# Patient Record
Sex: Male | Born: 1980 | Race: White | Hispanic: No | Marital: Married | Smoking: Never smoker
Health system: Southern US, Community
[De-identification: ages and names within clinical notes are randomized; demographics above are authoritative.]

## PROBLEM LIST (undated history)

## (undated) DIAGNOSIS — R569 Unspecified convulsions: Secondary | ICD-10-CM

## (undated) DIAGNOSIS — Q282 Arteriovenous malformation of cerebral vessels: Secondary | ICD-10-CM

## (undated) HISTORY — DX: Arteriovenous malformation of cerebral vessels: Q28.2

## (undated) HISTORY — DX: Unspecified convulsions: R56.9

## (undated) HISTORY — PX: FOOT CAPSULE RELEASE W/ PERCUTANEOUS HEEL CORD LENGTHENING, TIBIAL TENDON TRANSFER: SHX1658

## (undated) HISTORY — PX: APPENDECTOMY: SHX54

## (undated) HISTORY — PX: OTHER SURGICAL HISTORY: SHX169

---

## 2000-12-11 ENCOUNTER — Emergency Department (HOSPITAL_COMMUNITY): Admission: EM | Admit: 2000-12-11 | Discharge: 2000-12-11 | Payer: Self-pay | Admitting: Emergency Medicine

## 2002-01-04 ENCOUNTER — Encounter: Payer: Self-pay | Admitting: Emergency Medicine

## 2002-01-04 ENCOUNTER — Emergency Department (HOSPITAL_COMMUNITY): Admission: EM | Admit: 2002-01-04 | Discharge: 2002-01-04 | Payer: Self-pay | Admitting: Emergency Medicine

## 2004-08-06 ENCOUNTER — Emergency Department (HOSPITAL_COMMUNITY): Admission: EM | Admit: 2004-08-06 | Discharge: 2004-08-06 | Payer: Self-pay | Admitting: Emergency Medicine

## 2010-03-20 ENCOUNTER — Ambulatory Visit: Payer: Self-pay | Admitting: Diagnostic Radiology

## 2010-03-20 ENCOUNTER — Emergency Department (HOSPITAL_BASED_OUTPATIENT_CLINIC_OR_DEPARTMENT_OTHER): Admission: EM | Admit: 2010-03-20 | Discharge: 2010-03-20 | Payer: Self-pay | Admitting: Emergency Medicine

## 2010-07-08 LAB — BASIC METABOLIC PANEL
BUN: 18 mg/dL (ref 6–23)
Calcium: 9.8 mg/dL (ref 8.4–10.5)
GFR calc non Af Amer: >60 mL/min (ref >60)
Glucose, Bld: 114 mg/dL — ABNORMAL HIGH (ref 70–99)
Sodium: 142 mEq/L (ref 135–145)

## 2010-07-08 LAB — DIFFERENTIAL
Basophils Absolute: 0.1 10*3/uL (ref 0.0–0.1)
Basophils Relative: 1 % (ref 0–1)
Neutro Abs: 7.3 10*3/uL (ref 1.7–7.7)
Neutrophils Relative %: 63 % (ref 43–77)

## 2010-07-08 LAB — CBC
MCHC: 35.5 g/dL (ref 30.0–36.0)
Platelets: 171 10*3/uL (ref 150–400)
RDW: 11.1 % — ABNORMAL LOW (ref 11.5–15.5)

## 2012-08-16 ENCOUNTER — Telehealth: Payer: Self-pay | Admitting: *Deleted

## 2012-08-16 NOTE — Telephone Encounter (Signed)
Message copied by Harlon Flor Mariana Goytia L on Tue Aug 16, 2012 10:02 AM ------      Message from: Richrd Prime      Created: Tue Aug 16, 2012  8:44 AM      Contact: Patient       Patient states he has a conflict with his appt.  He wants to know if he can be pushed back the day of his appt. 08/19/2012.  He would like to be called asap.  You can contact him at either phone number.              [facility] ------

## 2012-08-16 NOTE — Telephone Encounter (Signed)
Called patient to resched, stated that he has been sched already

## 2012-08-19 ENCOUNTER — Institutional Professional Consult (permissible substitution): Payer: No Typology Code available for payment source | Admitting: Neurology

## 2012-08-26 ENCOUNTER — Encounter: Payer: Self-pay | Admitting: Neurology

## 2012-08-26 ENCOUNTER — Ambulatory Visit (INDEPENDENT_AMBULATORY_CARE_PROVIDER_SITE_OTHER): Payer: No Typology Code available for payment source | Admitting: Neurology

## 2012-08-26 VITALS — BP 120/70 | HR 68 | Ht 75.0 in | Wt 290.0 lb

## 2012-08-26 DIAGNOSIS — M79609 Pain in unspecified limb: Secondary | ICD-10-CM

## 2012-08-26 DIAGNOSIS — G40309 Generalized idiopathic epilepsy and epileptic syndromes, not intractable, without status epilepticus: Secondary | ICD-10-CM

## 2012-08-26 MED ORDER — LAMOTRIGINE ER 50 MG PO TB24: 50.0000 mg | ORAL_TABLET | Freq: Two times a day (BID) | ORAL | Status: DC

## 2012-08-26 NOTE — Progress Notes (Signed)
Reason for visit: Seizures   Michael Stark is a 32 y.o. 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 The patient last had a seizure in 2012. The patient indicates that he has been placed on low-dose Lamictal, and he is tolerating the medication well. The patient was on Keppra, but this resulted in too much irritability. The patient has had most of his seizures during sleep or in the early morning just prior to awakening. The patient has generalized tonic clonic seizure events. The patient has had other episodes that include transient numbness of the left face and left arm, and speech arrest. These are unassociated with cognitive clouding. The patient is operating a motor vehicle without problems. The patient works as a Runner, broadcasting/film/video. The patient comes to this office as he has recently moved to this area from [place], [place]. The patient reports some right foot pain with weightbearing, and he wishes to have a podiatry referral.  Past Medical History  Diagnosis Date  . Seizures   . 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 Appendectomy    . Heal lengthening    . Foot capsule release w/ percutaneous heel cord lengthening, tibial tendon transfer      Family History  Problem Relation Age of Onset  . Anxiety disorder Mother   . Anxiety disorder Brother     Social history:  reports that he has never smoked. He does not have any smokeless tobacco history on file. He reports that  drinks alcohol. He reports that he does not use illicit drugs.  Medications:  No current outpatient prescriptions on file prior to visit.   No current facility-administered medications on file prior to visit.    Allergies:  Allergies  Allergen Reactions  . Shellfish Allergy Nausea And Vomiting    Chills    ROS:  Out of a  complete 14 system review of symptoms, the patient complains only of the following symptoms, and all other reviewed systems are negative.  Snoring Blood in stool Episodes of numbness Seizures Depression Allergies  Blood pressure 120/70, pulse 68, height 6\' 3"  (1.905 m), weight 290 lb (131.543 kg).  Physical Exam  General: The patient is alert and cooperative at the time of the examination. The patient is mildly obese.  Head: Pupils are equal, round, and reactive to light. Discs are flat bilaterally.  Neck: The neck is supple, no carotid bruits are noted.  Respiratory: The respiratory examination is clear.  Cardiovascular: The cardiovascular examination reveals a regular rate and rhythm, no obvious murmurs or rubs are noted.  Skin: Extremities are without significant edema. The calf muscles of the left leg are atrophic compared to the right.  Neurologic Exam  Mental status:  Cranial nerves: Facial symmetry is present. There is good sensation of the face to pinprick and soft touch bilaterally. The strength of the facial muscles and the muscles to head turning and shoulder shrug are normal bilaterally. Speech is well enunciated, no aphasia or dysarthria is noted. Extraocular movements are full. Visual fields are full.  Motor: The motor testing reveals 5 over 5 strength of all 4 extremities. Good symmetric motor tone is noted throughout.  Sensory: Sensory testing is intact to pinprick, soft touch, vibration sensation, and position sense on all 4 extremities. No evidence of extinction is  noted.  Coordination: Cerebellar testing reveals good finger-nose-finger and heel-to-shin bilaterally.  Gait and station: Gait is normal. Tandem gait is normal. Romberg is negative. No drift is seen.  Reflexes: Deep tendon reflexes are symmetric and normal bilaterally. Toes are downgoing bilaterally.   Assessment/Plan:  1. History seizures  2. Right brain AVM  The patient is doing fairly  well at this time on low-dose Lamictal. If the episodes of speech arrest or left sided numbness become more frequent, I would prefer to increase the dose of the Lamictal. The patient will followup in 6-8 months. A prescription was written for the Lamictal.  Marlan Palau MD 08/28/2012 5:13 PM  [facility] [address] [place], [place] [address]  Phone [phone] Fax [phone]

## 2012-08-29 ENCOUNTER — Telehealth: Payer: Self-pay

## 2012-08-29 MED ORDER — LAMOTRIGINE 100 MG PO TABS: 100.0000 mg | ORAL_TABLET | Freq: Two times a day (BID) | ORAL | Status: DC

## 2012-08-29 NOTE — Telephone Encounter (Signed)
John from [facility] called and left a message with clinic saying they want to verify the Lamictal Rx.  They indicate the patient previously was taking 100mg  BID.  The current Rx they have is for 50mg  XR BID.  They state the insurance does not cover the XR at a BID dose, and they would like to know if the patient should in fact be taking the XR or if he should go back to regular release.  Please advise.  Thank you.

## 2012-08-29 NOTE — Telephone Encounter (Signed)
I called the pharmacy. The patient had indicated that he was only taking 50 mg twice daily of the Lamictal. In fact, the patient was on 100 mg twice daily. I will continue this prescription. The patient was given 60 tablets and 5 refills.

## 2013-02-25 ENCOUNTER — Other Ambulatory Visit: Payer: Self-pay | Admitting: Neurology

## 2013-02-28 ENCOUNTER — Other Ambulatory Visit: Payer: Self-pay

## 2013-03-20 ENCOUNTER — Encounter: Payer: Self-pay | Admitting: Neurology

## 2013-03-20 ENCOUNTER — Telehealth: Payer: Self-pay | Admitting: Neurology

## 2013-03-20 ENCOUNTER — Ambulatory Visit (INDEPENDENT_AMBULATORY_CARE_PROVIDER_SITE_OTHER): Payer: No Typology Code available for payment source | Admitting: Neurology

## 2013-03-20 ENCOUNTER — Encounter (INDEPENDENT_AMBULATORY_CARE_PROVIDER_SITE_OTHER): Payer: Self-pay

## 2013-03-20 VITALS — BP 115/67 | HR 70 | Temp 97.9°F | Ht 75.0 in | Wt 302.0 lb

## 2013-03-20 DIAGNOSIS — Z5181 Encounter for therapeutic drug level monitoring: Secondary | ICD-10-CM

## 2013-03-20 DIAGNOSIS — G40309 Generalized idiopathic epilepsy and epileptic syndromes, not intractable, without status epilepticus: Secondary | ICD-10-CM

## 2013-03-20 MED ORDER — TRAZODONE HCL 50 MG PO TABS: 50.0000 mg | ORAL_TABLET | ORAL | Status: DC | PRN

## 2013-03-20 MED ORDER — LAMOTRIGINE 100 MG PO TABS: 100.0000 mg | ORAL_TABLET | Freq: Two times a day (BID) | ORAL | Status: DC

## 2013-03-20 NOTE — Progress Notes (Signed)
    Reason for visit: Seizures  DENZEL ETIENNE is an 32 y.o. male  History of present illness:  Mr. Soley is a 33 year old right-handed white male with a history of seizures. The patient has done well since last seen, and he reports that the episodes of speech arrest that got much better. The patient is sleeping better with the Trazodone and he is tolerating the lamotrigine taking 100 mg twice daily. The patient is operating a motor vehicle without difficulty. The patient reports no other new medical issues that have come up since last seen. The patient is under less stress currently, overall doing better.  Past Medical History  Diagnosis Date  . Seizures   . 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 Appendectomy    . Heal lengthening    . Foot capsule release w/ percutaneous heel cord lengthening, tibial tendon transfer      Family History  Problem Relation Age of Onset  . Anxiety disorder Mother   . Anxiety disorder Brother     Social history:  reports that he has never smoked. He has never used smokeless tobacco. He reports that he drinks alcohol. He reports that he does not use illicit drugs.    Allergies  Allergen Reactions  . Other     Cats, and seasonal    Medications:  No current outpatient prescriptions on file prior to visit.   No current facility-administered medications on file prior to visit.    ROS:  Out of a complete 14 system review of symptoms, the patient complains only of the following symptoms, and all other reviewed systems are negative.  History of seizures Snoring Insomnia  Blood pressure 115/67, pulse 70, temperature 97.9 F (36.6 C), temperature source Oral, height 6\' 3"  (1.905 m), weight 302 lb (136.986 kg).  Physical Exam  General: The patient is alert and cooperative at the time of the examination. The patient is moderately obese.  Skin: No  significant peripheral edema is noted.   Neurologic Exam  Mental status: The patient is oriented x 3.  Cranial nerves: Facial symmetry is present. Speech is normal, no aphasia or dysarthria is noted. Extraocular movements are full. Visual fields are full.  Motor: The patient has good strength in all 4 extremities. There is atrophy of the left calf muscle relative to the right.  Sensory examination: Soft touch sensation on the face, arms, and legs is symmetric.  Coordination: The patient has good finger-nose-finger and heel-to-shin bilaterally.  Gait and station: The patient has a slightly limping type gait on the left leg. Tandem gait is unsteady. Romberg is negative. No drift is seen.  Reflexes: Deep tendon reflexes are symmetric, with the exception that the left ankle jerk reflexes depressed absent..   Assessment/Plan:   1. Seizures  The patient doing fairly well with the seizures this point. We will check blood work today, and followup with the patient in about 6-8 months. The patient continues to do well, he can be seen once a year.   Marlan Palau MD 03/20/2013 8:36 PM  [facility] [address] [place], [place] [address]  Phone [phone] Fax [phone]

## 2013-03-20 NOTE — Patient Instructions (Signed)
Epilepsy A seizure (convulsion) is a sudden change in brain function that causes a change in behavior, muscle activity, or ability to remain awake and alert. If a person has recurring seizures, this is called epilepsy. CAUSES  Epilepsy is a disorder with many possible causes. Anything that disturbs the normal pattern of brain cell activity can lead to seizures. Seizure can be caused from illness to brain damage to abnormal brain development. Epilepsy may develop because of:  An abnormality in brain wiring.  An imbalance of nerve signaling chemicals (neurotransmitters).  Some combination of these factors. Scientists are learning an increasing amount about genetic causes of seizures. SYMPTOMS  The symptoms of a seizure can vary greatly from one person to another. These may include:  An aura, or warning that tells a person they are about to have a seizure.  Abnormal sensations, such as abnormal smell or seeing flashing lights.  Sudden, general body stiffness.  Rhythmic jerking of the face, arm, or leg  on one or both sides.  Sudden change in consciousness.  The person may appear to be awake but not responding.  They may appear to be asleep but cannot be awakened.  Grimacing, chewing, lip smacking, or drooling.  Often there is a period of sleepiness after a seizure. DIAGNOSIS  The description you give to your caregiver about what you experienced will help them understand your problems. Equally important is the description by any witnesses to your seizure. A physical exam, including a detailed neurological exam, is necessary. An EEG (electroencephalogram) is a painless test of your brain waves. In this test a diagram is created of your brain waves. These diagrams can be interpreted by a specialist. Pictures of your brain are usually taken with:  An MRI.  A CT scan. Lab tests may be done to look for:  Signs of infection.  Abnormal blood chemistry. PREVENTION  There is no way to  prevent the development of epilepsy. If you have seizures that are typically triggered by an event (such as flashing lights), try to avoid the trigger. This can help you avoid a seizure.  PROGNOSIS  Most people with epilepsy lead outwardly normal lives. While epilepsy cannot currently be cured, for some people it does eventually go away. Most seizures do not cause brain damage. It is not uncommon for people with epilepsy, especially children, to develop behavioral and emotional problems. These problems are sometimes the consequence of medicine for seizures or social stress. For some people with epilepsy, the risk of seizures restricts their independence and recreational activities. For example, some states refuse drivers licenses to people with epilepsy. Most women with epilepsy can become pregnant. They should discuss their epilepsy and the medicine they are taking with their caregivers. Women with epilepsy have a 90 percent or better chance of having a normal, healthy baby. RISKS AND COMPLICATIONS  People with epilepsy are at increased risk of falls, accidents, and injuries. People with epilepsy are at special risk for two life-threatening conditions. These are status epilepticus and sudden unexplained death (extremely rare). Status epilepticus is a long lasting, continuous seizure that is a medical emergency. TREATMENT  Once epilepsy is diagnosed, it is important to begin treatment as soon as possible. For about 80 percent of those diagnosed with epilepsy, seizures can be controlled with modern medicines and surgical techniques. Some antiepileptic drugs can interfere with the effectiveness of oral contraceptives. In 1997, the FDA approved a pacemaker for the brain the (vagus nerve stimulator). This stimulator can be used for   people with seizures that are not well-controlled by medicine. Studies have shown that in some cases, children may experience fewer seizures if they maintain a strict diet. The strict  diet is called the ketogenic diet. This diet is rich in fats and low in carbohydrates. HOME CARE INSTRUCTIONS   Your caregiver will make recommendations about driving and safety in normal activities. Follow these carefully.  Take any medicine prescribed exactly as directed.  Do any blood tests requested to monitor the levels of your medicine.  The people you live and work with should know that you are prone to seizures. They should receive instructions on how to help you. In general, a witness to a seizure should:  Cushion your head and body.  Turn you on your side.  Avoid unnecessarily restraining you.  Not place anything inside your mouth.  Call for local emergency medical help if there is any question about what has occurred.  Keep a seizure diary. Record what you recall about any seizure, especially any possible trigger.  If your caregiver has given you a follow-up appointment, it is very important to keep that appointment. Not keeping the appointment could result in permanent injury and disability. If there is any problem keeping the appointment, you must call back to this facility for assistance. SEEK MEDICAL CARE IF:   You develop signs of infection or other illness. This might increase the risk of a seizure.  You seem to be having more frequent seizures.  Your seizure pattern is changing. SEEK IMMEDIATE MEDICAL CARE IF:   A seizure does not stop after a few moments.  A seizure causes any difficulty in breathing.  A seizure results in a very severe headache.  A seizure leaves you with the inability to speak or use a part of your body. MAKE SURE YOU:   Understand these instructions.  Will watch your condition.  Will get help right away if you are not doing well or get worse. Document Released: 04/13/2005 Document Revised: 07/06/2011 Document Reviewed: 11/23/2012 [employer] Patient Information 2014 [employer].  

## 2013-03-20 NOTE — Telephone Encounter (Signed)
The patient did not show for the appointment today. 

## 2013-03-20 NOTE — Telephone Encounter (Signed)
The patient actually showed up one hour late for his appointment. The patient was seen today.

## 2013-03-22 LAB — CBC WITH DIFFERENTIAL
Eos: 4 %
Eosinophils Absolute: 0.3 10*3/uL (ref 0.0–0.4)
Immature Granulocytes: 0 %
Lymphocytes Absolute: 2.1 10*3/uL (ref 0.7–3.1)
MCH: 30.5 pg (ref 26.6–33.0)
MCHC: 34.8 g/dL (ref 31.5–35.7)
MCV: 88 fL (ref 79–97)
Monocytes Absolute: 0.6 10*3/uL (ref 0.1–0.9)
Neutrophils Relative %: 59 %
Platelets: 171 10*3/uL (ref 150–379)
RBC: 5.08 x10E6/uL (ref 4.14–5.80)

## 2013-03-22 LAB — LAMOTRIGINE LEVEL: Lamotrigine Lvl: 3.7 ug/mL (ref 2.0–20.0)

## 2013-03-22 LAB — COMPREHENSIVE METABOLIC PANEL
AST: 31 IU/L (ref 0–40)
Albumin/Globulin Ratio: 2 (ref 1.1–2.5)
Alkaline Phosphatase: 67 IU/L (ref 39–117)
CO2: 21 mmol/L (ref 18–29)
Calcium: 9.4 mg/dL (ref 8.7–10.2)
Chloride: 103 mmol/L (ref 97–108)
Globulin, Total: 2.2 g/dL (ref 1.5–4.5)
Glucose: 100 mg/dL — ABNORMAL HIGH (ref 65–99)
Potassium: 4.4 mmol/L (ref 3.5–5.2)
Total Bilirubin: 0.5 mg/dL (ref 0.0–1.2)
Total Protein: 6.7 g/dL (ref 6.0–8.5)

## 2013-09-19 ENCOUNTER — Ambulatory Visit: Payer: Self-pay | Admitting: Adult Health

## 2014-04-12 ENCOUNTER — Other Ambulatory Visit: Payer: Self-pay | Admitting: Neurology

## 2014-04-12 NOTE — Telephone Encounter (Signed)
Patient no showed last appt.  I called, got no answer.  Left message.  

## 2014-09-05 ENCOUNTER — Other Ambulatory Visit: Payer: Self-pay | Admitting: Neurology

## 2014-09-05 NOTE — Telephone Encounter (Signed)
Patient has not been seen since 2014.  No showed last appt.  I called patient, got no answer.  Left message.

## 2014-12-18 ENCOUNTER — Telehealth: Payer: Self-pay

## 2014-12-18 ENCOUNTER — Ambulatory Visit (INDEPENDENT_AMBULATORY_CARE_PROVIDER_SITE_OTHER): Payer: BC Managed Care – PPO | Admitting: Family Medicine

## 2014-12-18 ENCOUNTER — Encounter: Payer: Self-pay | Admitting: Family Medicine

## 2014-12-18 ENCOUNTER — Encounter (INDEPENDENT_AMBULATORY_CARE_PROVIDER_SITE_OTHER): Payer: Self-pay

## 2014-12-18 VITALS — BP 112/68 | HR 52 | Temp 97.8°F | Ht 74.5 in | Wt 237.1 lb

## 2014-12-18 DIAGNOSIS — Z13220 Encounter for screening for lipoid disorders: Secondary | ICD-10-CM

## 2014-12-18 DIAGNOSIS — Z0000 Encounter for general adult medical examination without abnormal findings: Secondary | ICD-10-CM | POA: Insufficient documentation

## 2014-12-18 DIAGNOSIS — G40309 Generalized idiopathic epilepsy and epileptic syndromes, not intractable, without status epilepticus: Secondary | ICD-10-CM

## 2014-12-18 DIAGNOSIS — R41840 Attention and concentration deficit: Secondary | ICD-10-CM | POA: Diagnosis not present

## 2014-12-18 DIAGNOSIS — I499 Cardiac arrhythmia, unspecified: Secondary | ICD-10-CM | POA: Diagnosis not present

## 2014-12-18 NOTE — Progress Notes (Signed)
Subjective:  Patient ID: Michael Stark, male    DOB: Jul 16, 1980  Age: 34 y.o. MRN: [id number]  CC: Establish care/Annual physical exam.  HPI Michael Stark is a 34 y.o. male presents to the clinic today to establish care and for an annual physical exam.  Additionally, patient has a few complaints (see below).  1) Preventative Healthcare  Immunizations: Up to date.  Labs: Per [facility], patient in need of lipid panel..  Diet/Exercise: Monitors caloric intake (1500 - 2000 kcals daily); Exercises regularly. 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   Smoking/tobacco use: No.  HIV testing: Performed in college.   Regular dental exams: Yes.   2) Irregular heart beats  Patient reports that for the past few weeks he has felt irregular heart beats.  He states that he feels that his heart "stops" or "pauses" and then it "beats harder"  He reports some associated SOB when it occurs.  No chest pain.  Last for minutes to hours (occuring intermittently) and resolves spontaneously.  He states that it occurs primarily while at work.  No relieving factors. No exacerbating factors.  Patient limits caffeine but has been under a lot of stress at work.   3) Trouble focusing/Difficulty concentrating  Patient reports that he had ADHD as a child.  He has been recently under a lot of stress at work.  He states he has difficulty focusing on tasks, remember details and individuals names.  He attributes this mainly to stress but is slightly concerned.  PMH, Surgical Hx, Family Hx, Social History reviewed and updated as below. Past Medical History  Diagnosis Date  . Seizures   . 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 Appendectomy    . Heal lengthening    . Foot capsule release w/ percutaneous heel cord lengthening, tibial tendon transfer      Family History  Problem Relation Age of Onset  . Anxiety  disorder Mother   . Anxiety disorder Brother     Social History  Substance Use Topics  . Smoking status: Never Smoker   . Smokeless tobacco: Never Used  . Alcohol Use: 0.0 - 0.6 oz/week    0-1 Standard drinks or equivalent per week     Comment: Consumes alcohol twice per month    Review of Systems  HENT: Positive for tinnitus.   Respiratory:       SOB w/ irregular heart beat.  Cardiovascular:       Irregular heart beat.  Neurological: Positive for seizures.  Psychiatric/Behavioral: Positive for decreased concentration. The patient is nervous/anxious.        Stress. Memory difficulties.  All other systems negative.  Objective:   Today's Vitals: BP 112/68 mmHg  Pulse 52  Temp(Src) 97.8 F (36.6 C) (Oral)  Ht 6' 2.5" (1.892 m)  Wt 237 lb 2 oz (107.559 kg)  BMI 30.05 kg/m2  SpO2 96%  Physical Exam  Constitutional: He is oriented to person, place, and time. He appears well-developed and well-nourished. No distress.  HENT:  Head: Normocephalic and atraumatic.  Nose: Nose normal.  Mouth/Throat: Oropharynx is clear and moist. No oropharyngeal exudate.  Normal TM's bilaterally.   Eyes: Conjunctivae are normal. No scleral icterus.  Neck: Neck supple. No thyromegaly present.  Cardiovascular: Regular rhythm.  Bradycardia present.   No murmur heard. Pulmonary/Chest: Effort normal and breath sounds normal. He has no wheezes. He has no rales.  Abdominal: Soft. He exhibits no distension. There is no tenderness. There is no rebound and no guarding.  Musculoskeletal: Normal range of motion. He exhibits no edema.  Lymphadenopathy:    He has no cervical adenopathy.  Neurological: He is alert and oriented to person, place, and time.  Skin: Skin is warm and dry. No rash noted.  Psychiatric: His behavior is normal. Thought content normal.  Anxious.  Vitals reviewed.  Assessment & Plan:   Problem List Items Addressed This Visit    Difficulty concentrating    Offered referral to  Attention specialist; patient would like to wait at this time. I suspect this is more related to stress; Discussed therapy and patient declined.       Generalized convulsive epilepsy (Chronic)    Followed by Neurology. Stable on Lamictal.      Irregular heart beats    History consistent with PAC's/PVC's. Patient reassured but still concerned. Recommended Holter for complete evaluation; Will refer to Cardiology.      Preventative health care    Up to date on immunizations. Labs: Lipid panel today. Patient exercises regularly. Advised to continue.       Other Visit Diagnoses    Routine general medical examination at a health care facility    -  Primary    Relevant Orders    ABO AND RH  (Completed)    Screening for lipid disorders        Relevant Orders    Lipid panel       Outpatient Encounter Prescriptions as of 12/18/2014  Medication Sig  . cetirizine (ZYRTEC) 10 MG tablet Take 10 mg by mouth once.  . lamoTRIgine (LAMICTAL) 100 MG tablet TAKE 1 TABLET (100 MG TOTAL) BY MOUTH 2 (TWO) TIMES DAILY.  . [DISCONTINUED] traZODone (DESYREL) 50 MG tablet Take 1 tablet (50 mg total) by mouth as needed for sleep.  . [DISCONTINUED] UNABLE TO FIND Allergy injection 2-3 times weekly   No facility-administered encounter medications on file as of 12/18/2014.    Follow-up: Annual or sooner if needed.   Michael Spikes DO

## 2014-12-18 NOTE — Patient Instructions (Addendum)
It was nice to meet you today.  We will call with your lab results.  We will also call with your referral to Cardiology.  If you decide not to pursue this just let me know.  If you decide to pursue ADHD testing please let me know.  Follow up annually or sooner if needed.  Take care  Dr. Lacinda Axon  Health Maintenance A healthy lifestyle and preventative care can promote health and wellness.  Maintain regular health, dental, and eye exams.  Eat a healthy diet. Foods like vegetables, fruits, whole grains, low-fat dairy products, and lean protein foods contain the nutrients you need and are low in calories. Decrease your intake of foods high in solid fats, added sugars, and salt. Get information about a proper diet from your health care provider, if necessary.  Regular physical exercise is one of the most important things you can do for your health. Most adults should get at least 150 minutes of moderate-intensity exercise (any activity that increases your heart rate and causes you to sweat) each week. In addition, most adults need muscle-strengthening exercises on 2 or more days a week.   Maintain a healthy weight. The body mass index (BMI) is a screening tool to identify possible weight problems. It provides an estimate of body fat based on height and weight. Your health care provider can find your BMI and can help you achieve or maintain a healthy weight. For males 20 years and older:  A BMI below 18.5 is considered underweight.  A BMI of 18.5 to 24.9 is normal.  A BMI of 25 to 29.9 is considered overweight.  A BMI of 30 and above is considered obese.  Maintain normal blood lipids and cholesterol by exercising and minimizing your intake of saturated fat. Eat a balanced diet with plenty of fruits and vegetables. Blood tests for lipids and cholesterol should begin at age 52 and be repeated every 5 years. If your lipid or cholesterol levels are high, you are over age 46, or you are at high  risk for heart disease, you may need your cholesterol levels checked more frequently.Ongoing high lipid and cholesterol levels should be treated with medicines if diet and exercise are not working.  If you smoke, find out from your health care provider how to quit. If you do not use tobacco, do not start.  Lung cancer screening is recommended for adults aged 66-80 years who are at high risk for developing lung cancer because of a history of smoking. A yearly low-dose CT scan of the lungs is recommended for people who have at least a 30-pack-year history of smoking and are current smokers or have quit within the past 15 years. A pack year of smoking is smoking an average of 1 pack of cigarettes a day for 1 year (for example, a 30-pack-year history of smoking could mean smoking 1 pack a day for 30 years or 2 packs a day for 15 years). Yearly screening should continue until the smoker has stopped smoking for at least 15 years. Yearly screening should be stopped for people who develop a health problem that would prevent them from having lung cancer treatment.  If you choose to drink alcohol, do not have more than 2 drinks per day. One drink is considered to be 12 oz (360 mL) of beer, 5 oz (150 mL) of wine, or 1.5 oz (45 mL) of liquor.  Avoid the use of street drugs. Do not share needles with anyone. Ask for help if  you need support or instructions about stopping the use of drugs.  High blood pressure causes heart disease and increases the risk of stroke. Blood pressure should be checked at least every 1-2 years. Ongoing high blood pressure should be treated with medicines if weight loss and exercise are not effective.  If you are 74-33 years old, ask your health care provider if you should take aspirin to prevent heart disease.  Diabetes screening involves taking a blood sample to check your fasting blood sugar level. This should be done once every 3 years after age 13 if you are at a normal weight and  without risk factors for diabetes. Testing should be considered at a younger age or be carried out more frequently if you are overweight and have at least 1 risk factor for diabetes.  Colorectal cancer can be detected and often prevented. Most routine colorectal cancer screening begins at the age of 66 and continues through age 97. However, your health care provider may recommend screening at an earlier age if you have risk factors for colon cancer. On a yearly basis, your health care provider may provide home test kits to check for hidden blood in the stool. A small camera at the end of a tube may be used to directly examine the colon (sigmoidoscopy or colonoscopy) to detect the earliest forms of colorectal cancer. Talk to your health care provider about this at age 54 when routine screening begins. A direct exam of the colon should be repeated every 5-10 years through age 3, unless early forms of precancerous polyps or small growths are found.  People who are at an increased risk for hepatitis B should be screened for this virus. You are considered at high risk for hepatitis B if:  You were born in a country where hepatitis B occurs often. Talk with your health care provider about which countries are considered high risk.  Your parents were born in a high-risk country and you have not received a shot to protect against hepatitis B (hepatitis B vaccine).  You have HIV or AIDS.  You use needles to inject street drugs.  You live with, or have sex with, someone who has hepatitis B.  You are a man who has sex with other men (MSM).  You get hemodialysis treatment.  You take certain medicines for conditions like cancer, organ transplantation, and autoimmune conditions.  Hepatitis C blood testing is recommended for all people born from [birth year] through 1967 and any individual with known risk factors for hepatitis C.  Healthy men should no longer receive prostate-specific antigen (PSA) blood tests as  part of routine cancer screening. Talk to your health care provider about prostate cancer screening.  Testicular cancer screening is not recommended for adolescents or adult males who have no symptoms. Screening includes self-exam, a health care provider exam, and other screening tests. Consult with your health care provider about any symptoms you have or any concerns you have about testicular cancer.  Practice safe sex. Use condoms and avoid high-risk sexual practices to reduce the spread of sexually transmitted infections (STIs).  You should be screened for STIs, including gonorrhea and chlamydia if:  You are sexually active and are younger than 24 years.  You are older than 24 years, and your health care provider tells you that you are at risk for this type of infection.  Your sexual activity has changed since you were last screened, and you are at an increased risk for chlamydia or gonorrhea. Ask your  health care provider if you are at risk.  If you are at risk of being infected with HIV, it is recommended that you take a prescription medicine daily to prevent HIV infection. This is called pre-exposure prophylaxis (PrEP). You are considered at risk if:  You are a man who has sex with other men (MSM).  You are a heterosexual man who is sexually active with multiple partners.  You take drugs by injection.  You are sexually active with a partner who has HIV.  Talk with your health care provider about whether you are at high risk of being infected with HIV. If you choose to begin PrEP, you should first be tested for HIV. You should then be tested every 3 months for as long as you are taking PrEP.  Use sunscreen. Apply sunscreen liberally and repeatedly throughout the day. You should seek shade when your shadow is shorter than you. Protect yourself by wearing long sleeves, pants, a wide-brimmed hat, and sunglasses year round whenever you are outdoors.  Tell your health care provider of new  moles or changes in moles, especially if there is a change in shape or color. Also, tell your health care provider if a mole is larger than the size of a pencil eraser.  A one-time screening for abdominal aortic aneurysm (AAA) and surgical repair of large AAAs by ultrasound is recommended for men aged 81-75 years who are current or former smokers.  Stay current with your vaccines (immunizations). Document Released: 10/10/2007 Document Revised: 04/18/2013 Document Reviewed: 09/08/2010 [facility] Patient Information 2015 [place], [place]. This information is not intended to replace advice given to you by your health care provider. Make sure you discuss any questions you have with your health care provider.

## 2014-12-18 NOTE — Assessment & Plan Note (Signed)
Followed by Neurology. Stable on Lamictal.

## 2014-12-18 NOTE — Assessment & Plan Note (Signed)
Up to date on immunizations. Labs: Lipid panel today. Patient exercises regularly. Advised to continue.

## 2014-12-18 NOTE — Telephone Encounter (Signed)
error 

## 2014-12-18 NOTE — Progress Notes (Signed)
Pre visit review using our clinic review tool, if applicable. No additional management support is needed unless otherwise documented below in the visit note. 

## 2014-12-19 DIAGNOSIS — R41840 Attention and concentration deficit: Secondary | ICD-10-CM | POA: Insufficient documentation

## 2014-12-19 DIAGNOSIS — I499 Cardiac arrhythmia, unspecified: Secondary | ICD-10-CM | POA: Insufficient documentation

## 2014-12-19 LAB — LIPID PANEL
CHOLESTEROL: 132 mg/dL (ref 0–200)
HDL: 33.8 mg/dL — AB (ref >39.00)
LDL Cholesterol: 80 mg/dL (ref 0–99)
NonHDL: 97.8
TRIGLYCERIDES: 88 mg/dL (ref 0.0–149.0)
Total CHOL/HDL Ratio: 4
VLDL: 17.6 mg/dL (ref 0.0–40.0)

## 2014-12-19 LAB — ABO AND RH: Rh Type: NEGATIVE

## 2014-12-19 NOTE — Assessment & Plan Note (Addendum)
History consistent with PAC's/PVC's. Patient reassured but still concerned. Recommended Holter for complete evaluation; Will refer to Cardiology.

## 2014-12-19 NOTE — Assessment & Plan Note (Signed)
Offered referral to Attention specialist; patient would like to wait at this time. I suspect this is more related to stress; Discussed therapy and patient declined.

## 2015-01-30 ENCOUNTER — Ambulatory Visit: Payer: BC Managed Care – PPO | Admitting: Cardiology

## 2015-06-11 ENCOUNTER — Encounter (INDEPENDENT_AMBULATORY_CARE_PROVIDER_SITE_OTHER): Payer: Self-pay

## 2015-06-11 ENCOUNTER — Ambulatory Visit (INDEPENDENT_AMBULATORY_CARE_PROVIDER_SITE_OTHER): Payer: BC Managed Care – PPO | Admitting: Family Medicine

## 2015-06-11 ENCOUNTER — Encounter: Payer: Self-pay | Admitting: Family Medicine

## 2015-06-11 DIAGNOSIS — R05 Cough: Secondary | ICD-10-CM | POA: Diagnosis not present

## 2015-06-11 DIAGNOSIS — R059 Cough, unspecified: Secondary | ICD-10-CM | POA: Insufficient documentation

## 2015-06-11 DIAGNOSIS — L03031 Cellulitis of right toe: Secondary | ICD-10-CM | POA: Insufficient documentation

## 2015-06-11 DIAGNOSIS — L03032 Cellulitis of left toe: Secondary | ICD-10-CM | POA: Diagnosis not present

## 2015-06-11 MED ORDER — MUPIROCIN 2 % EX OINT: 1.0000 "application " | TOPICAL_OINTMENT | Freq: Two times a day (BID) | CUTANEOUS | Status: DC

## 2015-06-11 MED ORDER — HYDROCOD POLST-CPM POLST ER 10-8 MG/5ML PO SUER: 5.0000 mL | Freq: Two times a day (BID) | ORAL | Status: DC | PRN

## 2015-06-11 MED ORDER — DOXYCYCLINE HYCLATE 100 MG PO TABS: 100.0000 mg | ORAL_TABLET | Freq: Two times a day (BID) | ORAL | Status: DC

## 2015-06-11 NOTE — Assessment & Plan Note (Signed)
New problem. Exam unremarkable. Treating with Tussionex.

## 2015-06-11 NOTE — Assessment & Plan Note (Addendum)
Problem. Discussed incision and drainage versus MI therapy. Patient elected for antibiotic therapy. Treating with doxycycline. Advised warm soaks as well.

## 2015-06-11 NOTE — Patient Instructions (Signed)
Take the doxycycline twice daily for 10 days. Take with food.  Use the cough medication as needed.  Take care  Dr. Chalmers Guest Paronychia is an infection of the skin that surrounds a nail. It usually affects the skin around a fingernail, but it may also occur near a toenail. It often causes pain and swelling around the nail. This condition may come on suddenly or develop over a longer period. In some cases, a collection of pus (abscess) can form near or under the nail. Usually, paronychia is not serious and it clears up with treatment. CAUSES This condition may be caused by bacteria or fungi. It is commonly caused by either Streptococcus or Staphylococcus bacteria. The bacteria or fungi often cause the infection by getting into the affected area through an opening in the skin, such as a cut or a hangnail. RISK FACTORS This condition is more likely to develop in:  People who get their hands wet often, such as those who work as Designer, industrial/product, bartenders, or nurses.  People who bite their fingernails or suck their thumbs.  People who trim their nails too short.  People who have hangnails or injured fingertips.  People who get manicures.  People who have diabetes. SYMPTOMS Symptoms of this condition include:  Redness and swelling of the skin near the nail.  Tenderness around the nail when you touch the area.  Pus-filled bumps under the cuticle. The cuticle is the skin at the base or sides of the nail.  Fluid or pus under the nail.  Throbbing pain in the area. DIAGNOSIS This condition is usually diagnosed with a physical exam. In some cases, a sample of pus may be taken from an abscess to be tested in a lab. This can help to determine what type of bacteria or fungi is causing the condition. TREATMENT Treatment for this condition depends on the cause and severity of the condition. If the condition is mild, it may clear up on its own in a few days. Your health care provider  may recommend soaking the affected area in warm water a few times a day. When treatment is needed, the options may include:  Antibiotic medicine, if the condition is caused by a bacterial infection.  Antifungal medicine, if the condition is caused by a fungal infection.  Incision and drainage, if an abscess is present. In this procedure, the health care provider will cut open the abscess so the pus can drain out. HOME CARE INSTRUCTIONS  Soak the affected area in warm water if directed to do so by your health care provider. You may be told to do this for 20 minutes, 2-3 times a day. Keep the area dry in between soakings.  Take medicines only as directed by your health care provider.  If you were prescribed an antibiotic medicine, finish all of it even if you start to feel better.  Keep the affected area clean.  Do not try to drain a fluid-filled bump yourself.  If you will be washing dishes or performing other tasks that require your hands to get wet, wear rubber gloves. You should also wear gloves if your hands might come in contact with irritating substances, such as cleaners or chemicals.  Follow your health care provider's instructions about:  Wound care.  Bandage (dressing) changes and removal. SEEK MEDICAL CARE IF:  Your symptoms get worse or do not improve with treatment.  You have a fever or chills.  You have redness spreading from the affected area.  You have continued or increased fluid, blood, or pus coming from the affected area.  Your finger or knuckle becomes swollen or is difficult to move.   This information is not intended to replace advice given to you by your health care provider. Make sure you discuss any questions you have with your health care provider.   Document Released: 10/07/2000 Document Revised: 08/28/2014 Document Reviewed: 03/21/2014 Elsevier Interactive Patient Education [employer].

## 2015-06-11 NOTE — Progress Notes (Signed)
Pre visit review using our clinic review tool, if applicable. No additional management support is needed unless otherwise documented below in the visit note. 

## 2015-06-11 NOTE — Progress Notes (Signed)
Subjective:  Patient ID: Michael Stark, male    DOB: 01-Jun-1980  Age: 35 y.o. MRN: [id number]  CC: Toe pain, Cough  HPI:  35 year old male presents to clinic today with the above complaints.  Left 3rd toe pain  He states that last week he trimmed his nails and subsequently developed redness, pain, and irritation of the lateral nail bed of the third toe.  He reports it is exquisitely tender.  He's noted some drainage.  No known exacerbating or relieving factors.  He is concerned he has an infection.  No other associated symptoms.  Cough  Patient has recently developed a mildly productive cough.  No associated fevers or chills.  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  No known exacerbating or relieving factors.  Social Hx   Social History   Social History  . Marital Status: Divorced    Spouse Name: N/A  . Number of Children: 1  . Years of Education: Masters   Occupational History  . Other Other   Social History Main Topics  . Smoking status: Never Smoker   . Smokeless tobacco: Never Used  . Alcohol Use: 0.0 - 0.6 oz/week    0-1 Standard drinks or equivalent per week     Comment: Consumes alcohol twice per month  . Drug Use: No  . Sexual Activity:    Partners: Female   Other Topics Concern  . None   Social History Narrative   Patient [facility] on [facility].   Caffeine Use: 2-3 drinks a week    Review of Systems  Constitutional: Negative.   Musculoskeletal:       Toe pain/redness.    Objective:  BP 120/64 mmHg  Pulse 61  Temp(Src) 98 F (36.7 C) (Oral)  Ht 6' 2.5" (1.892 m)  Wt 249 lb (112.946 kg)  BMI 31.55 kg/m2  SpO2 96%  BP/Weight 06/11/2015 12/18/2014 [id number]  Systolic BP [id number] [id number]  Diastolic BP 64 68 67  Wt. (Lbs) 249 237.13 302  BMI 31.55 30.05 37.75   Physical Exam  Constitutional: He is oriented to person, place, and time. He appears well-developed. No distress.    Cardiovascular: Normal rate and regular rhythm.   No murmur heard. Pulmonary/Chest: Effort normal and breath sounds normal.  Musculoskeletal:  Left foot - 3rd toe with redness, erythema; tenderness to palpation. Purulent drainage noted.   Neurological: He is alert and oriented to person, place, and time.  Psychiatric: He has a normal mood and affect.  Vitals reviewed.  Lab Results  Component Value Date   WBC 7.6 03/20/2013   HGB 15.5 03/20/2013   HCT 44.6 03/20/2013   PLT 171 03/20/2013   GLUCOSE 100* 03/20/2013   CHOL 132 12/18/2014   TRIG 88.0 12/18/2014   HDL 33.80* 12/18/2014   LDLCALC 80 12/18/2014   ALT 42 03/20/2013   AST 31 03/20/2013   NA 142 03/20/2013   K 4.4 03/20/2013   CL 103 03/20/2013   CREATININE 0.95 03/20/2013   BUN 16 03/20/2013   CO2 21 03/20/2013    Assessment & Plan:   Problem List Items Addressed This Visit    Paronychia of toe of left foot    Problem. Discussed incision and drainage versus MI therapy. Patient elected for antibiotic therapy. Treating with doxycycline. Advised warm soaks as well.      Relevant Medications   mupirocin ointment (BACTROBAN) 2 %   Cough  New problem. Exam unremarkable. Treating with Tussionex.        Meds ordered this encounter  Medications  . doxycycline (VIBRA-TABS) 100 MG tablet    Sig: Take 1 tablet (100 mg total) by mouth 2 (two) times daily.    Dispense:  20 tablet    Refill:  0  . chlorpheniramine-HYDROcodone (TUSSIONEX PENNKINETIC ER) 10-8 MG/5ML SUER    Sig: Take 5 mLs by mouth every 12 (twelve) hours as needed.    Dispense:  115 mL    Refill:  0  . mupirocin ointment (BACTROBAN) 2 %    Sig: Place 1 application into the nose 2 (two) times daily.    Dispense:  22 g    Refill:  0   Follow-up: PRN  [place]

## 2016-01-25 ENCOUNTER — Ambulatory Visit: Payer: BLUE CROSS/BLUE SHIELD

## 2016-01-25 ENCOUNTER — Ambulatory Visit (INDEPENDENT_AMBULATORY_CARE_PROVIDER_SITE_OTHER): Payer: BLUE CROSS/BLUE SHIELD | Admitting: Family Medicine

## 2016-01-25 VITALS — BP 114/78 | HR 55 | Temp 98.5°F | Resp 20 | Ht 74.0 in | Wt 251.2 lb

## 2016-01-25 DIAGNOSIS — M542 Cervicalgia: Secondary | ICD-10-CM | POA: Diagnosis not present

## 2016-01-25 MED ORDER — NAPROXEN 500 MG PO TABS: 500.0000 mg | ORAL_TABLET | Freq: Two times a day (BID) | ORAL | 0 refills | Status: DC

## 2016-01-25 MED ORDER — CYCLOBENZAPRINE HCL 10 MG PO TABS: 10.0000 mg | ORAL_TABLET | Freq: Three times a day (TID) | ORAL | 0 refills | Status: AC | PRN

## 2016-01-25 NOTE — Progress Notes (Signed)
   HPI  Patient presents today here with neck pain.  Patient explains that his symptoms began about 4 days ago after sleeping wrong on his pillow. It started out as left-sided neck pain with radiation to his left shoulder, and has shifted now knee has bilateral deep achy neck pain, worse with movement. There is no alleviating factors.  He denies any arm symptoms, specifically no weakness, numbness, or change in function.  He has a history of convulsive epilepsy treated with Lamictal. 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  He does note that he's noticed recently he has a small area of numbness on his medial left great toe. This has not changed and has not been associated with any pains or injuries.  PMH: Smoking status noted ROS: Per HPI  Objective: BP 114/78 (BP Location: Right Arm, Patient Position: Sitting, Cuff Size: Large)   Pulse (!) 55   Temp 98.5 F (36.9 C) (Oral)   Resp 20   Ht 6\' 2"  (1.88 m)   Wt 251 lb 3.2 oz (113.9 kg)   SpO2 98%   BMI 32.25 kg/m  Gen: NAD, alert, cooperative with exam HEENT: NCAT CV: RRR, good S1/S2, no murmur Resp: CTABL, no wheezes, non-labored Ext: No edema, warm Neuro: Alert and oriented, strength 5/5 and sensation intact in bilateral upper extremities Musculoskeletal No tenderness to palpation of cervical spine or bilateral paraspinal muscles  Assessment and plan:  # Neck pain Most consistent with neck muscle spasm Scheduled NSAIDs 5-7 days and up to 14 days, also given Flexeril Recommended ice or heat as needed Return to clinic with any concerns or failure to improve as expected.   Meds ordered this encounter  Medications  . naproxen (NAPROSYN) 500 MG tablet    Sig: Take 1 tablet (500 mg total) by mouth 2 (two) times daily with a meal.    Dispense:  28 tablet    Refill:  0  . cyclobenzaprine (FLEXERIL) 10 MG tablet    Sig: Take 1 tablet (10 mg total) by mouth 3  (three) times daily as needed for muscle spasms.    Dispense:  30 tablet    Refill:  0    Kenn File, MD 12:32 PM

## 2016-01-25 NOTE — Patient Instructions (Addendum)
Great to meet you!   Muscle Cramps and Spasms Muscle cramps and spasms occur when a muscle or muscles tighten and you have no control over this tightening (involuntary muscle contraction). They are a common problem and can develop in any muscle. The most common place is in the calf muscles of the leg. Both muscle cramps and muscle spasms are involuntary muscle contractions, but they also have differences:   Muscle cramps are sporadic and painful. They may last a few seconds to a quarter of an hour. Muscle cramps are often more forceful and last longer than muscle spasms.  Muscle spasms may or may not be painful. They may also last just a few seconds or much longer. CAUSES  It is uncommon for cramps or spasms to be due to a serious underlying problem. In many cases, the cause of cramps or spasms is unknown. Some common causes are:   Overexertion.   Overuse from repetitive motions (doing the same thing over and over).   Remaining in a certain position for a long period of time.   Improper preparation, form, or technique while performing a sport or activity.   Dehydration.   Injury.   Side effects of some medicines.   Abnormally low levels of the salts and ions in your blood (electrolytes), especially potassium and calcium. This could happen if you are taking water pills (diuretics) or you are pregnant.  Some underlying medical problems can make it more likely to develop cramps or spasms. These include, but are not limited to:   Diabetes.   Parkinson disease.   Hormone disorders, such as thyroid problems.   Alcohol abuse.   Diseases specific to muscles, joints, and bones.   Blood vessel disease where not enough blood is getting to the muscles.  HOME CARE INSTRUCTIONS   Stay well hydrated. Drink enough water and fluids to keep your urine clear or pale yellow.  It may be helpful to massage, stretch, and relax the affected muscle.  For tight or tense muscles,  use a warm towel, heating pad, or hot shower water directed to the affected area.  If you are sore or have pain after a cramp or spasm, applying ice to the affected area may relieve discomfort.  Put ice in a plastic bag.  Place a towel between your skin and the bag.  Leave the ice on for 15-20 minutes, 03-04 times a day.  Medicines used to treat a known cause of cramps or spasms may help reduce their frequency or severity. Only take over-the-counter or prescription medicines as directed by your caregiver. SEEK MEDICAL CARE IF:  Your cramps or spasms get more severe, more frequent, or do not improve over time.  MAKE SURE YOU:   Understand these instructions.  Will watch your condition.  Will get help right away if you are not doing well or get worse.   This information is not intended to replace advice given to you by your health care provider. Make sure you discuss any questions you have with your health care provider.   Document Released: 10/03/2001 Document Revised: 08/08/2012 Document Reviewed: 03/30/2012 Elsevier Interactive Patient Education 2016 [employer].    IF you received an x-ray today, you will receive an invoice from [facility]. Please contact [facility] at [phone] with questions or concerns regarding your invoice.   IF you received labwork today, you will receive an invoice from [employer]. Please contact [facility] at [phone] with questions or concerns regarding your invoice.  Our billing staff will not be able to assist you with questions regarding bills from these companies.  You will be contacted with the lab results as soon as they are available. The fastest way to get your results is to activate your My Chart account. Instructions are located on the last page of this paperwork. If you have not heard from [place] regarding the results in 2 weeks, please contact this office.

## 2016-10-30 ENCOUNTER — Encounter: Payer: BC Managed Care – PPO | Admitting: Family Medicine

## 2018-06-14 ENCOUNTER — Telehealth: Payer: Self-pay | Admitting: Family Medicine

## 2018-06-14 ENCOUNTER — Encounter: Payer: Self-pay | Admitting: Internal Medicine

## 2018-06-14 ENCOUNTER — Ambulatory Visit: Payer: Managed Care, Other (non HMO) | Admitting: Internal Medicine

## 2018-06-14 VITALS — BP 118/64 | HR 78 | Temp 98.0°F | Wt 252.9 lb

## 2018-06-14 DIAGNOSIS — R059 Cough, unspecified: Secondary | ICD-10-CM

## 2018-06-14 DIAGNOSIS — J101 Influenza due to other identified influenza virus with other respiratory manifestations: Secondary | ICD-10-CM

## 2018-06-14 DIAGNOSIS — R05 Cough: Secondary | ICD-10-CM

## 2018-06-14 MED ORDER — BENZONATATE 100 MG PO CAPS: 100.0000 mg | ORAL_CAPSULE | Freq: Two times a day (BID) | ORAL | 0 refills | Status: DC | PRN

## 2018-06-14 NOTE — Telephone Encounter (Signed)
Copied from [place] [phone]. Topic: Quick Communication - Rx Refill/Question >> Jun 14, 2018  5:07 PM Alanda Slim E wrote: Medication: benzonatate (TESSALON) 100 MG capsule - needs to be sent to [facility] [place] [place] [address] [phone] (Phone) [phone] (Fax)  [facility]

## 2018-06-14 NOTE — Patient Instructions (Signed)
-Nice meeting you! -May use tessalon perles for cough up to three times a day.   Influenza, Adult Influenza is also called "the flu." It is an infection in the lungs, nose, and throat (respiratory tract). It is caused by a virus. The flu causes symptoms that are similar to symptoms of a cold. It also causes a high fever and body aches. The flu spreads easily from person to person (is contagious). Getting a flu shot (influenza vaccination) every year is the best way to prevent the flu. What are the causes? This condition is caused by the influenza virus. You can get the virus by:  Breathing in droplets that are in the air from the cough or sneeze of a person who has the virus.  Touching something that has the virus on it (is contaminated) and then touching your mouth, nose, or eyes. What increases the risk? Certain things may make you more likely to get the flu. These include:  Not washing your hands often.  Having close contact with many people during cold and flu season.  Touching your mouth, eyes, or nose without first washing your hands.  Not getting a flu shot every year. You may have a higher risk for the flu, along with serious problems such as a lung infection (pneumonia), if you:  Are older than 65.  Are pregnant.  Have a weakened disease-fighting system (immune system) because of a disease or taking certain medicines.  Have a long-term (chronic) illness, such as: ? Heart, kidney, or lung disease. ? Diabetes. ? Asthma.  Have a liver disorder.  Are very overweight (morbidly obese).  Have anemia. This is a condition that affects your red blood cells. What are the signs or symptoms? Symptoms usually begin suddenly and last 4-14 days. They may include:  Fever and chills.  Headaches, body aches, or muscle aches.  Sore throat.  Cough.  Runny or stuffy (congested) nose.  Chest discomfort.  Not wanting to eat as much as normal (poor appetite).  Weakness or  feeling tired (fatigue).  Dizziness.  Feeling sick to your stomach (nauseous) or throwing up (vomiting). How is this treated? If the flu is found early, you can be treated with medicine that can help reduce how bad the illness is and how long it lasts (antiviral medicine). This may be given by mouth (orally) or through an IV tube. Taking care of yourself at home can help your symptoms get better. Your doctor may suggest:  Taking over-the-counter medicines.  Drinking plenty of fluids. The flu often goes away on its own. If you have very bad symptoms or other problems, you may be treated in a hospital. Follow these instructions at home:     Activity  Rest as needed. Get plenty of sleep.  Stay home from work or school as told by your doctor. ? Do not leave home until you do not have a fever for 24 hours without taking medicine. ? Leave home only to visit your doctor. Eating and drinking  Take an ORS (oral rehydration solution). This is a drink that is sold at pharmacies and stores.  Drink enough fluid to keep your pee (urine) pale yellow.  Drink clear fluids in small amounts as you are able. Clear fluids include: ? Water. ? Ice chips. ? Fruit juice that has water added (diluted fruit juice). ? Low-calorie sports drinks.  Eat bland, easy-to-digest foods in small amounts as you are able. These foods include: ? Bananas. ? Applesauce. ? Rice. ? [employer]  meats. ? Toast. ? Crackers.  Do not eat or drink: ? Fluids that have a lot of sugar or caffeine. ? Alcohol. ? Spicy or fatty foods. General instructions  Take over-the-counter and prescription medicines only as told by your doctor.  Use a cool mist humidifier to add moisture to the air in your home. This can make it easier for you to breathe.  Cover your mouth and nose when you cough or sneeze.  Wash your hands with soap and water often, especially after you cough or sneeze. If you cannot use soap and water, use  alcohol-based hand sanitizer.  Keep all follow-up visits as told by your doctor. This is important. How is this prevented?   Get a flu shot every year. You may get the flu shot in late summer, fall, or winter. Ask your doctor when you should get your flu shot.  Avoid contact with people who are sick during fall and winter (cold and flu season). Contact a doctor if:  You get new symptoms.  You have: ? Chest pain. ? Watery poop (diarrhea). ? A fever.  Your cough gets worse.  You start to have more mucus.  You feel sick to your stomach.  You throw up. Get help right away if you:  Have shortness of breath.  Have trouble breathing.  Have skin or nails that turn a bluish color.  Have very bad pain or stiffness in your neck.  Get a sudden headache.  Get sudden pain in your face or ear.  Cannot eat or drink without throwing up. Summary  Influenza ("the flu") is an infection in the lungs, nose, and throat. It is caused by a virus.  Take over-the-counter and prescription medicines only as told by your doctor.  Getting a flu shot every year is the best way to avoid getting the flu. This information is not intended to replace advice given to you by your health care provider. Make sure you discuss any questions you have with your health care provider. Document Released: 01/21/2008 Document Revised: 09/29/2017 Document Reviewed: 09/29/2017 Elsevier Interactive Patient Education  2019 [employer].

## 2018-06-14 NOTE — Telephone Encounter (Signed)
Medication reordered and sent to requested pharmacy

## 2018-06-14 NOTE — Progress Notes (Signed)
Acute Office Visit     CC/Reason for Visit: Cough  HPI: Michael Stark is a 38 y.o. male who is coming in today for the above mentioned reasons. Last week he was diagnosed with Flu A at a local urgent care. Was prescribed tamiflu and tessalon perles for cough. Only took 3 days of tamiflu as he was having vivid dreams and stomach upset that he thought was due to the medication. He works as a Patent examiner. Most symptoms have resolved with the exception of a non-productive cough. He has taken all of the tessalon he was given. Wants to know if he can go back to work.   Past Medical/Surgical History: Past Medical History:  Diagnosis Date  . 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 Seizures ([place])     Past Surgical History:  Procedure Laterality Date  . APPENDECTOMY    . FOOT CAPSULE RELEASE W/ PERCUTANEOUS HEEL CORD LENGTHENING, TIBIAL TENDON TRANSFER    . heal lengthening      Social History:  reports that he has never smoked. He has never used smokeless tobacco. He reports current alcohol use. He reports that he does not use drugs.  Allergies: Allergies  Allergen Reactions  . Other     Cats, and seasonal  . Cefaclor Rash    Family History:  Family History  Problem Relation Age of Onset  . Anxiety disorder Mother   . Anxiety disorder Brother      Current Outpatient Medications:  .  cetirizine (ZYRTEC) 10 MG tablet, Take 10 mg by mouth once., Disp: , Rfl:  .  lamoTRIgine (LAMICTAL) 100 MG tablet, TAKE 1 TABLET (100 MG TOTAL) BY MOUTH 2 (TWO) TIMES DAILY., Disp: 20 tablet, Rfl: 0 .  benzonatate (TESSALON) 100 MG capsule, Take 1 capsule (100 mg total) by mouth 2 (two) times daily as needed for cough., Disp: 30 capsule, Rfl: 0 .  cyclobenzaprine (FLEXERIL) 10 MG tablet, Take 1 tablet (10 mg total) by mouth 3 (three) times daily as needed for muscle spasms. (Patient not taking: Reported on 06/14/2018), Disp: 30 tablet, Rfl: 0 .   naproxen (NAPROSYN) 500 MG tablet, Take 1 tablet (500 mg total) by mouth 2 (two) times daily with a meal. (Patient not taking: Reported on 06/14/2018), Disp: 28 tablet, Rfl: 0  Review of Systems:  Constitutional: Denies fever, chills, diaphoresis, appetite change and fatigue currently.  HEENT: Denies photophobia, eye pain, redness, hearing loss, ear pain, congestion, sore throat, rhinorrhea, sneezing, mouth sores, trouble swallowing, neck pain, neck stiffness and tinnitus.   Respiratory: Denies SOB, DOE,  chest tightness,  and wheezing.   Cardiovascular: Denies chest pain, palpitations and leg swelling.  Gastrointestinal: Denies nausea, vomiting, abdominal pain, diarrhea, constipation, blood in stool and abdominal distention.  Musculoskeletal: Denies myalgias, back pain, joint swelling, arthralgias and gait problem.     Physical Exam: Vitals:   06/14/18 1611  BP: 118/64  Pulse: 78  Temp: 98 F (36.7 C)  TempSrc: Oral  SpO2: 97%  Weight: 252 lb 14.4 oz (114.7 kg)    Body mass index is 32.47 kg/m.   Constitutional: NAD, calm, comfortable Eyes: PERRL, lids and conjunctivae normal ENMT: Mucous membranes are moist. Posterior pharynx clear of any exudate or lesions. Normal dentition. Tympanic membrane is pearly white, no erythema or bulging. Neck: normal, supple, no masses, no thyromegaly Respiratory: clear to auscultation bilaterally, no wheezing, no crackles. Normal respiratory effort. No accessory muscle use.  Cardiovascular:  Regular rate and rhythm, no murmurs / rubs / gallops. No extremity edema. 2+ pedal pulses. No carotid bruits.  Psychiatric: Normal judgment and insight. Alert and oriented x 3. Normal mood.    Impression and Plan:  Cough in adult  Influenza A  -Cough likely post-viral; advised it may take up to 4-6 weeks for cough to completely resolve. -He feels significantly improved from last week. -No concerns for PNA at present. -RTC in 10-14 if no improvement or  worsening.    Patient Instructions  -Nice meeting you! -May use tessalon perles for cough up to three times a day.   Influenza, Adult Influenza is also called "the flu." It is an infection in the lungs, nose, and throat (respiratory tract). It is caused by a virus. The flu causes symptoms that are similar to symptoms of a cold. It also causes a high fever and body aches. The flu spreads easily from person to person (is contagious). Getting a flu shot (influenza vaccination) every year is the best way to prevent the flu. What are the causes? This condition is caused by the influenza virus. You can get the virus by:  Breathing in droplets that are in the air from the cough or sneeze of a person who has the virus.  Touching something that has the virus on it (is contaminated) and then touching your mouth, nose, or eyes. What increases the risk? Certain things may make you more likely to get the flu. These include:  Not washing your hands often.  Having close contact with many people during cold and flu season.  Touching your mouth, eyes, or nose without first washing your hands.  Not getting a flu shot every year. You may have a higher risk for the flu, along with serious problems such as a lung infection (pneumonia), if you:  Are older than 65.  Are pregnant.  Have a weakened disease-fighting system (immune system) because of a disease or taking certain medicines.  Have a long-term (chronic) illness, such as: ? Heart, kidney, or lung disease. ? Diabetes. ? Asthma.  Have a liver disorder.  Are very overweight (morbidly obese).  Have anemia. This is a condition that affects your red blood cells. What are the signs or symptoms? Symptoms usually begin suddenly and last 4-14 days. They may include:  Fever and chills.  Headaches, body aches, or muscle aches.  Sore throat.  Cough.  Runny or stuffy (congested) nose.  Chest discomfort.  Not wanting to eat as much as  normal (poor appetite).  Weakness or feeling tired (fatigue).  Dizziness.  Feeling sick to your stomach (nauseous) or throwing up (vomiting). How is this treated? If the flu is found early, you can be treated with medicine that can help reduce how bad the illness is and how long it lasts (antiviral medicine). This may be given by mouth (orally) or through an IV tube. Taking care of yourself at home can help your symptoms get better. Your doctor may suggest:  Taking over-the-counter medicines.  Drinking plenty of fluids. The flu often goes away on its own. If you have very bad symptoms or other problems, you may be treated in a hospital. Follow these instructions at home:     Activity  Rest as needed. Get plenty of sleep.  Stay home from work or school as told by your doctor. ? Do not leave home until you do not have a fever for 24 hours without taking medicine. ? Leave home only to visit  your doctor. Eating and drinking  Take an ORS (oral rehydration solution). This is a drink that is sold at pharmacies and stores.  Drink enough fluid to keep your pee (urine) pale yellow.  Drink clear fluids in small amounts as you are able. Clear fluids include: ? Water. ? Ice chips. ? Fruit juice that has water added (diluted fruit juice). ? Low-calorie sports drinks.  Eat bland, easy-to-digest foods in small amounts as you are able. These foods include: ? Bananas. ? Applesauce. ? Rice. ? Lean meats. ? Toast. ? Crackers.  Do not eat or drink: ? Fluids that have a lot of sugar or caffeine. ? Alcohol. ? Spicy or fatty foods. General instructions  Take over-the-counter and prescription medicines only as told by your doctor.  Use a cool mist humidifier to add moisture to the air in your home. This can make it easier for you to breathe.  Cover your mouth and nose when you cough or sneeze.  Wash your hands with soap and water often, especially after you cough or sneeze. If you  cannot use soap and water, use alcohol-based hand sanitizer.  Keep all follow-up visits as told by your doctor. This is important. How is this prevented?   Get a flu shot every year. You may get the flu shot in late summer, fall, or winter. Ask your doctor when you should get your flu shot.  Avoid contact with people who are sick during fall and winter (cold and flu season). Contact a doctor if:  You get new symptoms.  You have: ? Chest pain. ? Watery poop (diarrhea). ? A fever.  Your cough gets worse.  You start to have more mucus.  You feel sick to your stomach.  You throw up. Get help right away if you:  Have shortness of breath.  Have trouble breathing.  Have skin or nails that turn a bluish color.  Have very bad pain or stiffness in your neck.  Get a sudden headache.  Get sudden pain in your face or ear.  Cannot eat or drink without throwing up. Summary  Influenza ("the flu") is an infection in the lungs, nose, and throat. It is caused by a virus.  Take over-the-counter and prescription medicines only as told by your doctor.  Getting a flu shot every year is the best way to avoid getting the flu. This information is not intended to replace advice given to you by your health care provider. Make sure you discuss any questions you have with your health care provider. Document Released: 01/21/2008 Document Revised: 09/29/2017 Document Reviewed: 09/29/2017 Elsevier Interactive Patient Education  2019 [place], MD [facility]

## 2018-06-22 ENCOUNTER — Ambulatory Visit: Payer: Managed Care, Other (non HMO) | Admitting: Internal Medicine

## 2018-06-22 ENCOUNTER — Encounter: Payer: Self-pay | Admitting: Internal Medicine

## 2018-06-22 VITALS — BP 110/70 | HR 77 | Temp 98.6°F | Ht 74.0 in | Wt 244.9 lb

## 2018-06-22 DIAGNOSIS — Z0000 Encounter for general adult medical examination without abnormal findings: Secondary | ICD-10-CM | POA: Diagnosis not present

## 2018-06-22 DIAGNOSIS — G8194 Hemiplegia, unspecified affecting left nondominant side: Secondary | ICD-10-CM | POA: Diagnosis not present

## 2018-06-22 DIAGNOSIS — F909 Attention-deficit hyperactivity disorder, unspecified type: Secondary | ICD-10-CM | POA: Insufficient documentation

## 2018-06-22 DIAGNOSIS — M2352 Chronic instability of knee, left knee: Secondary | ICD-10-CM | POA: Diagnosis not present

## 2018-06-22 NOTE — Progress Notes (Signed)
Established Patient Office Visit     CC/Reason for Visit: Establish care, CPE  HPI: Michael Stark is a 38 y.o. male who is coming in today for the above mentioned reasons. Past Medical History is significant for: ADHD for which he is prescribed Vyvanse by his neurologist, he also has a history of seizures well-controlled on Lamictal and residual left hemiparesis and muscle atrophy for which he does not have a diagnosis yet but follows with neurology routinely; this has been present since his teenage years.  His only complaint today is that his left knee has become unstable.  He states that routinely, due to his laxity of ligaments and his muscular condition, his left knee will "pop out and pop back in" on its own. It is very painful and has become much more frequent. 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  He is a never smoker, drinks ETOH socially, no illicit substances.  Works as a Nurse, learning disability. Is married, has a 67 yr old daughter.  Fam Hx: MGF with DM II, no h/o cancer.  UTD on immunizations. Not at age for routine cancer screening.   Past Medical/Surgical History: Past Medical History:  Diagnosis Date  . 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 Seizures ([place])     Past Surgical History:  Procedure Laterality Date  . APPENDECTOMY    . FOOT CAPSULE RELEASE W/ PERCUTANEOUS HEEL CORD LENGTHENING, TIBIAL TENDON TRANSFER    . heal lengthening      Social History:  reports that he has never smoked. He has never used smokeless tobacco. He reports current alcohol use. He reports that he does not use drugs.  Allergies: Allergies  Allergen Reactions  . Other     Cats, and seasonal  . Cefaclor Rash    Family History:  Family History  Problem Relation Age of Onset  . Anxiety disorder Mother   . Anxiety disorder Brother      Current Outpatient Medications:  .   benzonatate (TESSALON) 100 MG capsule, Take 1 capsule (100 mg total) by mouth 2 (two) times daily as needed for cough., Disp: 30 capsule, Rfl: 0 .  cetirizine (ZYRTEC) 10 MG tablet, Take 10 mg by mouth once., Disp: , Rfl:  .  cyclobenzaprine (FLEXERIL) 10 MG tablet, Take 1 tablet (10 mg total) by mouth 3 (three) times daily as needed for muscle spasms., Disp: 30 tablet, Rfl: 0 .  lamoTRIgine (LAMICTAL) 100 MG tablet, TAKE 1 TABLET (100 MG TOTAL) BY MOUTH 2 (TWO) TIMES DAILY., Disp: 20 tablet, Rfl: 0 .  naproxen (NAPROSYN) 500 MG tablet, Take 1 tablet (500 mg total) by mouth 2 (two) times daily with a meal., Disp: 28 tablet, Rfl: 0 .  VYVANSE 20 MG capsule, neurology, Disp: , Rfl:   Review of Systems:  Constitutional: Denies fever, chills, diaphoresis, appetite change and fatigue.  HEENT: Denies photophobia, eye pain, redness, hearing loss, ear pain, congestion, sore throat, rhinorrhea, sneezing, mouth sores, trouble swallowing, neck pain, neck stiffness and tinnitus.   Respiratory: Denies SOB, DOE, cough, chest tightness,  and wheezing.   Cardiovascular: Denies chest pain, palpitations and leg swelling.  Gastrointestinal: Denies nausea, vomiting, abdominal pain, diarrhea, constipation, blood in stool and abdominal distention.  Genitourinary: Denies dysuria, urgency, frequency, hematuria, flank pain and difficulty urinating.  Endocrine: Denies: hot or cold intolerance, sweats, changes in hair or nails, polyuria, polydipsia.  Musculoskeletal: Denies myalgias, back pain, joint swelling, arthralgias and gait problem.  Skin: Denies pallor, rash and wound.  Neurological: Denies dizziness, seizures, syncope, weakness, light-headedness, numbness and headaches.  Hematological: Denies adenopathy. Easy bruising, personal or family bleeding history  Psychiatric/Behavioral: Denies suicidal ideation, mood changes, confusion, nervousness, sleep disturbance and agitation    Physical Exam: Vitals:   06/22/18  1545  BP: 110/70  Pulse: 77  Temp: 98.6 F (37 C)  TempSrc: Oral  SpO2: 96%  Weight: 244 lb 14.4 oz (111.1 kg)  Height: 6\' 2"  (1.88 m)    Body mass index is 31.44 kg/m.   Constitutional: NAD, calm, comfortable Eyes: PERRL, lids and conjunctivae normal,wears corrective lenses. ENMT: Mucous membranes are moist. Posterior pharynx clear of any exudate or lesions. Normal dentition. Tympanic membrane is pearly white, no erythema or bulging. Neck: normal, supple, no masses, no thyromegaly, no carotid bruits. Respiratory: clear to auscultation bilaterally, no wheezing, no crackles. Normal respiratory effort. No accessory muscle use.  Cardiovascular: Regular rate and rhythm, no murmurs / rubs / gallops. No extremity edema. 2+ pedal pulses. No carotid bruits.  Abdomen: no tenderness, no masses palpated. No hepatosplenomegaly. Bowel sounds positive.  Musculoskeletal: no clubbing / cyanosis. No joint deformity upper and lower extremities. Good ROM, no contractures. Normal muscle tone.  Skin: no rashes, lesions, ulcers. No induration Neurologic: CN 2-12 grossly intact. Sensation intact, DTR normal. Strength 5/5 in all 4.  Psychiatric: Normal judgment and insight. Alert and oriented x 3. Normal mood.    Impression and Plan:  Encounter for preventive health examination  -Has routine eye care. Advised routine dental care. -UTD on immunizations. -Labs today.  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 -[facility]ed [facility] neurologist at [facility].  Attention deficit hyperactivity disorder (ADHD), unspecified ADHD type -On Vyvanse prescribed by neurologist  Recurrent left knee instability  -He has used OTC knee supports and braces with only mild relief. -Will refer to sports medicine for further recommendations.     Lelon Frohlich, MD [facility] at [facility]

## 2018-06-24 ENCOUNTER — Encounter: Payer: Self-pay | Admitting: Internal Medicine

## 2018-06-24 ENCOUNTER — Other Ambulatory Visit (INDEPENDENT_AMBULATORY_CARE_PROVIDER_SITE_OTHER): Payer: Managed Care, Other (non HMO)

## 2018-06-24 DIAGNOSIS — Z0000 Encounter for general adult medical examination without abnormal findings: Secondary | ICD-10-CM | POA: Diagnosis not present

## 2018-06-24 DIAGNOSIS — G8194 Hemiplegia, unspecified affecting left nondominant side: Secondary | ICD-10-CM

## 2018-06-24 DIAGNOSIS — E785 Hyperlipidemia, unspecified: Secondary | ICD-10-CM | POA: Insufficient documentation

## 2018-06-24 LAB — VITAMIN B12: Vitamin B-12: 387 pg/mL (ref 211–911)

## 2018-06-24 LAB — CBC WITH DIFFERENTIAL/PLATELET
BASOS PCT: 0.8 % (ref 0.0–3.0)
Basophils Absolute: 0 10*3/uL (ref 0.0–0.1)
Eosinophils Absolute: 0 10*3/uL (ref 0.0–0.7)
Eosinophils Relative: 0.8 % (ref 0.0–5.0)
HCT: 45.2 % (ref 39.0–52.0)
Hemoglobin: 15.7 g/dL (ref 13.0–17.0)
Lymphocytes Relative: 28.4 % (ref 12.0–46.0)
Lymphs Abs: 1.6 10*3/uL (ref 0.7–4.0)
MCHC: 34.8 g/dL (ref 30.0–36.0)
MCV: 87.3 fl (ref 78.0–100.0)
Monocytes Absolute: 0.4 10*3/uL (ref 0.1–1.0)
Monocytes Relative: 8 % (ref 3.0–12.0)
NEUTROS ABS: 3.5 10*3/uL (ref 1.4–7.7)
Neutrophils Relative %: 62 % (ref 43.0–77.0)
Platelets: 192 10*3/uL (ref 150.0–400.0)
RBC: 5.17 Mil/uL (ref 4.22–5.81)
RDW: 11.9 % (ref 11.5–15.5)
WBC: 5.6 10*3/uL (ref 4.0–10.5)

## 2018-06-24 LAB — COMPREHENSIVE METABOLIC PANEL
ALT: 24 U/L (ref 0–53)
AST: 22 U/L (ref 0–37)
Albumin: 4.8 g/dL (ref 3.5–5.2)
Alkaline Phosphatase: 57 U/L (ref 39–117)
BUN: 22 mg/dL (ref 6–23)
CHLORIDE: 104 meq/L (ref 96–112)
CO2: 26 mEq/L (ref 19–32)
CREATININE: 0.97 mg/dL (ref 0.40–1.50)
Calcium: 9.6 mg/dL (ref 8.4–10.5)
GFR: 86.01 mL/min (ref >60.00)
Glucose, Bld: 80 mg/dL (ref 70–99)
Potassium: 4.8 mEq/L (ref 3.5–5.1)
Sodium: 140 mEq/L (ref 135–145)
Total Bilirubin: 0.7 mg/dL (ref 0.2–1.2)
Total Protein: 7.2 g/dL (ref 6.0–8.3)

## 2018-06-24 LAB — LIPID PANEL
Cholesterol: 172 mg/dL (ref 0–200)
HDL: 39.1 mg/dL (ref >39.00)
LDL Cholesterol: 123 mg/dL — ABNORMAL HIGH (ref 0–99)
NonHDL: 133.26
TRIGLYCERIDES: 53 mg/dL (ref 0.0–149.0)
Total CHOL/HDL Ratio: 4
VLDL: 10.6 mg/dL (ref 0.0–40.0)

## 2018-06-24 LAB — TSH: TSH: 0.99 u[IU]/mL (ref 0.35–4.50)

## 2018-06-29 ENCOUNTER — Encounter: Payer: Self-pay | Admitting: Internal Medicine

## 2018-07-08 ENCOUNTER — Ambulatory Visit: Payer: Managed Care, Other (non HMO) | Admitting: Family Medicine

## 2019-07-07 ENCOUNTER — Other Ambulatory Visit: Payer: Self-pay

## 2019-07-10 ENCOUNTER — Ambulatory Visit (INDEPENDENT_AMBULATORY_CARE_PROVIDER_SITE_OTHER): Payer: Managed Care, Other (non HMO) | Admitting: Family Medicine

## 2019-07-10 ENCOUNTER — Encounter: Payer: Self-pay | Admitting: Family Medicine

## 2019-07-10 VITALS — BP 118/66 | HR 64 | Temp 97.7°F | Wt 243.6 lb

## 2019-07-10 DIAGNOSIS — S39011A Strain of muscle, fascia and tendon of abdomen, initial encounter: Secondary | ICD-10-CM

## 2019-07-10 NOTE — Patient Instructions (Signed)
Suspect musculoskeletal pain  Watch for any fever, stool changes, progressive pain or other concerns.
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
## 2020-04-04 ENCOUNTER — Encounter: Payer: Self-pay | Admitting: Internal Medicine

## 2020-04-04 ENCOUNTER — Ambulatory Visit (INDEPENDENT_AMBULATORY_CARE_PROVIDER_SITE_OTHER): Payer: Managed Care, Other (non HMO)

## 2020-04-04 ENCOUNTER — Ambulatory Visit: Payer: Managed Care, Other (non HMO) | Admitting: Internal Medicine

## 2020-04-04 ENCOUNTER — Other Ambulatory Visit: Payer: Self-pay

## 2020-04-04 VITALS — BP 112/72 | HR 72 | Temp 98.2°F | Ht 74.0 in | Wt 237.0 lb

## 2020-04-04 DIAGNOSIS — M5412 Radiculopathy, cervical region: Secondary | ICD-10-CM | POA: Diagnosis not present

## 2020-04-04 DIAGNOSIS — M542 Cervicalgia: Secondary | ICD-10-CM | POA: Diagnosis not present

## 2020-04-04 MED ORDER — PREDNISONE 10 MG (21) PO TBPK: ORAL_TABLET | ORAL | 0 refills | Status: DC

## 2020-04-04 NOTE — Progress Notes (Signed)
Established Patient Office Visit     This visit occurred during the SARS-CoV-2 public health emergency.  Safety protocols were in place, including screening questions prior to the visit, additional usage of staff PPE, and extensive cleaning of exam room while observing appropriate contact time as indicated for disinfecting solutions.    CC/Reason for Visit: Discuss neck pain and left arm issues  HPI: Michael Stark is a 39 y.o. male who is coming in today for the above mentioned reasons.  He has a history of a left sided hemiparesis ever since childhood.  Best has been explained to him, the anesthetic that he received for an ear tube surgery caused damage to some upper motor neurons that in turn caused his hemiparesis.  He has chronic weakness and left-sided muscle atrophy of both his left arm and leg.  About a week ago he started noticing some neck pain especially when turning his head to the left.  There has been no injury or trauma that he can recall.  Soon after he has started noticing pinpricks sensation and numbness down his arm into his fingertips.  "Feels like electrical shocks".  He has also noticed temperature differentials like an icy cold sensation followed by a hot sensation.  He has not noticed any new weakness of his left arm and no new changes to his legs.  He denies any bowel or bladder incontinence, no saddle anesthesia.  Sleeping is starting to become very difficult.   Past Medical/Surgical History: Past Medical History:  Diagnosis Date  . 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 Seizures ([place])     Past Surgical History:  Procedure Laterality Date  . APPENDECTOMY    . FOOT CAPSULE RELEASE W/ PERCUTANEOUS HEEL CORD LENGTHENING, TIBIAL TENDON TRANSFER    . heal lengthening      Social History:  reports that he has never smoked. He has never used smokeless tobacco. He reports current alcohol use. He reports that he does not use  drugs.  Allergies: Allergies  Allergen Reactions  . Other     Cats, and seasonal  . Cefaclor Rash    Family History:  Family History  Problem Relation Age of Onset  . Anxiety disorder Mother   . Anxiety disorder Brother      Current Outpatient Medications:  .  cetirizine (ZYRTEC) 10 MG tablet, Take 10 mg by mouth once., Disp: , Rfl:  .  cyclobenzaprine (FLEXERIL) 10 MG tablet, Take 1 tablet (10 mg total) by mouth 3 (three) times daily as needed for muscle spasms., Disp: 30 tablet, Rfl: 0 .  lamoTRIgine (LAMICTAL) 100 MG tablet, TAKE 1 TABLET (100 MG TOTAL) BY MOUTH 2 (TWO) TIMES DAILY., Disp: 20 tablet, Rfl: 0 .  naproxen (NAPROSYN) 500 MG tablet, Take 1 tablet (500 mg total) by mouth 2 (two) times daily with a meal., Disp: 28 tablet, Rfl: 0 .  VYVANSE 20 MG capsule, neurology, Disp: , Rfl:  .  predniSONE (STERAPRED UNI-PAK 21 TAB) 10 MG (21) TBPK tablet, Take as directed, Disp: 21 tablet, Rfl: 0  Review of Systems:  Constitutional: Denies fever, chills, diaphoresis, appetite change and fatigue.  HEENT: Denies photophobia, eye pain, redness, hearing loss, ear pain, congestion, sore throat, rhinorrhea, sneezing, mouth sores, trouble swallowing, neck pain, neck stiffness and tinnitus.   Respiratory: Denies SOB, DOE, cough, chest tightness,  and wheezing.   Cardiovascular: Denies chest pain, palpitations and leg swelling.  Gastrointestinal: Denies nausea, vomiting, abdominal  pain, diarrhea, constipation, blood in stool and abdominal distention.  Genitourinary: Denies dysuria, urgency, frequency, hematuria, flank pain and difficulty urinating.  Endocrine: Denies: hot or cold intolerance, sweats, changes in hair or nails, polyuria, polydipsia. Musculoskeletal: Denies myalgias, back pain, joint swelling, arthralgias and gait problem.  Skin: Denies pallor, rash and wound.  Neurological: Denies dizziness, syncope, light-headedness and headaches.  Hematological: Denies adenopathy. Easy  bruising, personal or family bleeding history  Psychiatric/Behavioral: Denies suicidal ideation, mood changes, confusion, nervousness, sleep disturbance and agitation    Physical Exam: Vitals:   04/04/20 1359  BP: 112/72  Pulse: 72  Temp: 98.2 F (36.8 C)  TempSrc: Oral  SpO2: 97%  Weight: 237 lb (107.5 kg)  Height: 6\' 2"  (1.88 m)    Body mass index is 30.43 kg/m.   Constitutional: NAD, calm, comfortable Eyes: PERRL, lids and conjunctivae normal, wears corrective lenses ENMT: Mucous membranes are moist.  Respiratory: clear to auscultation bilaterally, no wheezing, no crackles. Normal respiratory effort. No accessory muscle use.  Cardiovascular: Regular rate and rhythm, no murmurs / rubs / gallops. No extremity edema.   Psychiatric: Normal judgment and insight. Alert and oriented x 3. Normal mood.    Impression and Plan:  Neck pain Cervical radiculopathy  -Concerned about cervical disc disease. -I will give him a prednisone course to hopefully try and reduce any inflammation. -He will be sent for C-spine x-ray today, may need C-spine MRI/neurosurgical referral pending results.   Patient Instructions  -Nice seeing you today!!  -Cervical spine xray. May need MRI pending results.  -Prednisone as directed on package insert for 6 days.     Lelon Frohlich, MD [facility]

## 2020-04-04 NOTE — Patient Instructions (Signed)
-  Nice seeing you today!!  -Cervical spine xray. May need MRI pending results.  -Prednisone as directed on package insert for 6 days.

## 2020-04-09 ENCOUNTER — Other Ambulatory Visit: Payer: Self-pay | Admitting: Internal Medicine

## 2020-04-09 DIAGNOSIS — M5412 Radiculopathy, cervical region: Secondary | ICD-10-CM

## 2020-04-15 NOTE — Progress Notes (Signed)
Chief Complaint  Patient presents with  . Acute Visit    Shingles on LT side abdomen x 5 days.      HPI: Michael Stark 39 y.o. come in for SDA  PCP NA   Seen    12 14   prednisone   Given  Helped and now returning  Some  Tingling  Not as painful  Left  Neck area .   Positional some times    Talk about getting mri    About this .  But today has rash on left side trunk saw allergist who said was shingles Onset  Dec 17+  Not that painful  At this time .  But  Skin is sensitive .  Had varicella as a child .  Child is vaccinated for varicella  Not an infant  ROS: See pertinent positives and negatives per HPI.  Past Medical History:  Diagnosis Date  . 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 Seizures ([place])     Family History  Problem Relation Age of Onset  . Anxiety disorder Mother   . Anxiety disorder Brother     Social History   Socioeconomic History  . Marital status: Married    Spouse name: Not on file  . Number of children: 1  . Years of education: Masters  . Highest education level: Not on file  Occupational History  . Occupation: Other    Employer: OTHER  Tobacco Use  . Smoking status: Never Smoker  . Smokeless tobacco: Never Used  Substance and Sexual Activity  . Alcohol use: Yes    Alcohol/week: 0.0 - 1.0 standard drinks    Comment: Consumes alcohol twice per month  . Drug use: No  . Sexual activity: Yes    Partners: Female  Other Topics Concern  . Not on file  Social History Narrative   Patient lives on campus of [facility]   Caffeine Use: 2-3 drinks a week    Social Determinants of Health   Financial Resource Strain: Not on file  Food Insecurity: Not on file  Transportation Needs: Not on file  Physical Activity: Not on file  Stress: Not on file  Social Connections: Not on file    Outpatient Medications Prior to Visit  Medication Sig Dispense Refill  . cetirizine (ZYRTEC) 10 MG tablet Take  10 mg by mouth once.    . lamoTRIgine (LAMICTAL) 100 MG tablet TAKE 1 TABLET (100 MG TOTAL) BY MOUTH 2 (TWO) TIMES DAILY. 20 tablet 0  . VYVANSE 20 MG capsule neurology    . naproxen (NAPROSYN) 500 MG tablet Take 1 tablet (500 mg total) by mouth 2 (two) times daily with a meal. 28 tablet 0  . cyclobenzaprine (FLEXERIL) 10 MG tablet Take 1 tablet (10 mg total) by mouth 3 (three) times daily as needed for muscle spasms. (Patient not taking: Reported on 04/16/2020) 30 tablet 0  . predniSONE (STERAPRED UNI-PAK 21 TAB) 10 MG (21) TBPK tablet Take as directed (Patient not taking: Reported on 04/16/2020) 21 tablet 0   No facility-administered medications prior to visit.     EXAM:  BP 116/70   Pulse (!) 54   Temp 97.6 F (36.4 C) (Oral)   Ht 6\' 2"  (1.88 m)   Wt 243 lb 12.8 oz (110.6 kg)   SpO2 99%   BMI 31.30 kg/m   Body mass index is 31.3 kg/m.  GENERAL: vitals reviewed and listed above, alert, oriented,  appears well hydrated and in no acute distress HEENT: atraumatic, conjunctiva  clear, no obvious abnormalities on inspection of external nose and ears OP : masked  NECK: no obvious masses on inspection palpation  MS: moves all extremities without noticeable  Gross focal  Abnormality( has remote old  ue weakness  Not tested Skin  dermatomal red rash in blotches  And some abraded area  No crusting at this time  PSYCH: pleasant and cooperative, no obvious depression or anxiety  BP Readings from Last 3 Encounters:  04/16/20 116/70  04/04/20 112/72  07/10/19 118/66    ASSESSMENT AND PLAN:  Discussed the following assessment and plan:  Herpes zoster without complication  Medication management Disc shingles and options and course   Add valtex  asap    Expectant management.    Fu pcp about the cervical  Radicular sx .   Refill naprosyn also  -Patient advised to return or notify health care team  if  new concerns arise.  Patient Instructions  I agree this is   Shingles .   We  can try valtrex but most  Effective if taken early .       Shingles  Shingles, which is also known as herpes zoster, is an infection that causes a painful skin rash and fluid-filled blisters. It is caused by a virus. Shingles only develops in people who:  Have had chickenpox.  Have been given a medicine to protect against chickenpox (have been vaccinated). Shingles is rare in this group. What are the causes? Shingles is caused by varicella-zoster virus (VZV). This is the same virus that causes chickenpox. After a person is exposed to VZV, the virus stays in the body in an inactive (dormant) state. Shingles develops if the virus is reactivated. This can happen many years after the first (initial) exposure to VZV. It is not known what causes this virus to be reactivated. What increases the risk? People who have had chickenpox or received the chickenpox vaccine are at risk for shingles. Shingles infection is more common in people who:  Are older than age 11.  Have a weakened disease-fighting system (immune system), such as people with: ? HIV. ? AIDS. ? Cancer.  Are taking medicines that weaken the immune system, such as transplant medicines.  Are experiencing a lot of stress. What are the signs or symptoms? Early symptoms of this condition include itching, tingling, and pain in an area on your skin. Pain may be described as burning, stabbing, or throbbing. A few days or weeks after early symptoms start, a painful red rash appears. The rash is usually on one side of the body and has a band-like or belt-like pattern. The rash eventually turns into fluid-filled blisters that break open, change into scabs, and dry up in about 2-3 weeks. At any time during the infection, you may also develop:  A fever.  Chills.  A headache.  An upset stomach. How is this diagnosed? This condition is diagnosed with a skin exam. Skin or fluid samples may be taken from the blisters before a diagnosis  is made. These samples are examined under a microscope or sent to a lab for testing. How is this treated? The rash may last for several weeks. There is not a specific cure for this condition. Your health care provider will probably prescribe medicines to help you manage pain, recover more quickly, and avoid long-term problems. Medicines may include:  Antiviral drugs.  Anti-inflammatory drugs.  Pain medicines.  Anti-itching medicines (antihistamines).  If the area involved is on your face, you may be referred to a specialist, such as an eye doctor (ophthalmologist) or an ear, nose, and throat (ENT) doctor (otolaryngologist) to help you avoid eye problems, chronic pain, or disability. Follow these instructions at home: Medicines  Take over-the-counter and prescription medicines only as told by your health care provider.  Apply an anti-itch cream or numbing cream to the affected area as told by your health care provider. Relieving itching and discomfort   Apply cold, wet cloths (cold compresses) to the area of the rash or blisters as told by your health care provider.  Cool baths can be soothing. Try adding baking soda or dry oatmeal to the water to reduce itching. Do not bathe in hot water. Blister and rash care  Keep your rash covered with a loose bandage (dressing). Wear loose-fitting clothing to help ease the pain of material rubbing against the rash.  Keep your rash and blisters clean by washing the area with mild soap and cool water as told by your health care provider.  Check your rash every day for signs of infection. Check for: ? More redness, swelling, or pain. ? Fluid or blood. ? Warmth. ? Pus or a bad smell.  Do not scratch your rash or pick at your blisters. To help avoid scratching: ? Keep your fingernails clean and cut short. ? Wear gloves or mittens while you sleep, if scratching is a problem. General instructions  Rest as told by your health care  provider.  Keep all follow-up visits as told by your health care provider. This is important.  Wash your hands often with soap and water. If soap and water are not available, use hand sanitizer. Doing this lowers your chance of getting a bacterial skin infection.  Before your blisters change into scabs, your shingles infection can cause chickenpox in people who have never had it or have never been vaccinated against it. To prevent this from happening, avoid contact with other people, especially: ? Babies. ? Pregnant women. ? Children who have eczema. ? Elderly people who have transplants. ? People who have chronic illnesses, such as cancer or AIDS. Contact a health care provider if:  Your pain is not relieved with prescribed medicines.  Your pain does not get better after the rash heals.  You have signs of infection in the rash area, such as: ? More redness, swelling, or pain around the rash. ? Fluid or blood coming from the rash. ? The rash area feeling warm to the touch. ? Pus or a bad smell coming from the rash. Get help right away if:  The rash is on your face or nose.  You have facial pain, pain around your eye area, or loss of feeling on one side of your face.  You have difficulty seeing.  You have ear pain or have ringing in your ear.  You have a loss of taste.  Your condition gets worse. Summary  Shingles, which is also known as herpes zoster, is an infection that causes a painful skin rash and fluid-filled blisters.  This condition is diagnosed with a skin exam. Skin or fluid samples may be taken from the blisters and examined before the diagnosis is made.  Keep your rash covered with a loose bandage (dressing). Wear loose-fitting clothing to help ease the pain of material rubbing against the rash.  Before your blisters change into scabs, your shingles infection can cause chickenpox in people who have never had it or  have never been vaccinated against it. This  information is not intended to replace advice given to you by your health care provider. Make sure you discuss any questions you have with your health care provider. Document Revised: 08/05/2018 Document Reviewed: 12/16/2016 Elsevier Patient Education  2020 [employer] Shere M.D.

## 2020-04-16 ENCOUNTER — Other Ambulatory Visit: Payer: Self-pay

## 2020-04-16 ENCOUNTER — Encounter: Payer: Self-pay | Admitting: Internal Medicine

## 2020-04-16 ENCOUNTER — Ambulatory Visit: Payer: Managed Care, Other (non HMO) | Admitting: Internal Medicine

## 2020-04-16 VITALS — BP 116/70 | HR 54 | Temp 97.6°F | Ht 74.0 in | Wt 243.8 lb

## 2020-04-16 DIAGNOSIS — Z79899 Other long term (current) drug therapy: Secondary | ICD-10-CM

## 2020-04-16 DIAGNOSIS — B029 Zoster without complications: Secondary | ICD-10-CM

## 2020-04-16 MED ORDER — VALACYCLOVIR HCL 1 G PO TABS: 1000.0000 mg | ORAL_TABLET | Freq: Three times a day (TID) | ORAL | 0 refills | Status: DC

## 2020-04-16 MED ORDER — NAPROXEN 500 MG PO TABS: 500.0000 mg | ORAL_TABLET | Freq: Two times a day (BID) | ORAL | 0 refills | Status: DC

## 2020-04-16 NOTE — Patient Instructions (Addendum)
I agree this is   Shingles .   We can try valtrex but most  Effective if taken early .       Shingles  Shingles, which is also known as herpes zoster, is an infection that causes a painful skin rash and fluid-filled blisters. It is caused by a virus. Shingles only develops in people who:  Have had chickenpox.  Have been given a medicine to protect against chickenpox (have been vaccinated). Shingles is rare in this group. What are the causes? Shingles is caused by varicella-zoster virus (VZV). This is the same virus that causes chickenpox. After a person is exposed to VZV, the virus stays in the body in an inactive (dormant) state. Shingles develops if the virus is reactivated. This can happen many years after the first (initial) exposure to VZV. It is not known what causes this virus to be reactivated. What increases the risk? People who have had chickenpox or received the chickenpox vaccine are at risk for shingles. Shingles infection is more common in people who:  Are older than age 16.  Have a weakened disease-fighting system (immune system), such as people with: ? HIV. ? AIDS. ? Cancer.  Are taking medicines that weaken the immune system, such as transplant medicines.  Are experiencing a lot of stress. What are the signs or symptoms? Early symptoms of this condition include itching, tingling, and pain in an area on your skin. Pain may be described as burning, stabbing, or throbbing. A few days or weeks after early symptoms start, a painful red rash appears. The rash is usually on one side of the body and has a band-like or belt-like pattern. The rash eventually turns into fluid-filled blisters that break open, change into scabs, and dry up in about 2-3 weeks. At any time during the infection, you may also develop:  A fever.  Chills.  A headache.  An upset stomach. How is this diagnosed? This condition is diagnosed with a skin exam. Skin or fluid samples may be taken  from the blisters before a diagnosis is made. These samples are examined under a microscope or sent to a lab for testing. How is this treated? The rash may last for several weeks. There is not a specific cure for this condition. Your health care provider will probably prescribe medicines to help you manage pain, recover more quickly, and avoid long-term problems. Medicines may include:  Antiviral drugs.  Anti-inflammatory drugs.  Pain medicines.  Anti-itching medicines (antihistamines). If the area involved is on your face, you may be referred to a specialist, such as an eye doctor (ophthalmologist) or an ear, nose, and throat (ENT) doctor (otolaryngologist) to help you avoid eye problems, chronic pain, or disability. Follow these instructions at home: Medicines  Take over-the-counter and prescription medicines only as told by your health care provider.  Apply an anti-itch cream or numbing cream to the affected area as told by your health care provider. Relieving itching and discomfort   Apply cold, wet cloths (cold compresses) to the area of the rash or blisters as told by your health care provider.  Cool baths can be soothing. Try adding baking soda or dry oatmeal to the water to reduce itching. Do not bathe in hot water. Blister and rash care  Keep your rash covered with a loose bandage (dressing). Wear loose-fitting clothing to help ease the pain of material rubbing against the rash.  Keep your rash and blisters clean by washing the area with mild soap and cool  water as told by your health care provider.  Check your rash every day for signs of infection. Check for: ? More redness, swelling, or pain. ? Fluid or blood. ? Warmth. ? Pus or a bad smell.  Do not scratch your rash or pick at your blisters. To help avoid scratching: ? Keep your fingernails clean and cut short. ? Wear gloves or mittens while you sleep, if scratching is a problem. General instructions  Rest as told  by your health care provider.  Keep all follow-up visits as told by your health care provider. This is important.  Wash your hands often with soap and water. If soap and water are not available, use hand sanitizer. Doing this lowers your chance of getting a bacterial skin infection.  Before your blisters change into scabs, your shingles infection can cause chickenpox in people who have never had it or have never been vaccinated against it. To prevent this from happening, avoid contact with other people, especially: ? Babies. ? Pregnant women. ? Children who have eczema. ? Elderly people who have transplants. ? People who have chronic illnesses, such as cancer or AIDS. Contact a health care provider if:  Your pain is not relieved with prescribed medicines.  Your pain does not get better after the rash heals.  You have signs of infection in the rash area, such as: ? More redness, swelling, or pain around the rash. ? Fluid or blood coming from the rash. ? The rash area feeling warm to the touch. ? Pus or a bad smell coming from the rash. Get help right away if:  The rash is on your face or nose.  You have facial pain, pain around your eye area, or loss of feeling on one side of your face.  You have difficulty seeing.  You have ear pain or have ringing in your ear.  You have a loss of taste.  Your condition gets worse. Summary  Shingles, which is also known as herpes zoster, is an infection that causes a painful skin rash and fluid-filled blisters.  This condition is diagnosed with a skin exam. Skin or fluid samples may be taken from the blisters and examined before the diagnosis is made.  Keep your rash covered with a loose bandage (dressing). Wear loose-fitting clothing to help ease the pain of material rubbing against the rash.  Before your blisters change into scabs, your shingles infection can cause chickenpox in people who have never had it or have never been vaccinated  against it. This information is not intended to replace advice given to you by your health care provider. Make sure you discuss any questions you have with your health care provider. Document Revised: 08/05/2018 Document Reviewed: 12/16/2016 Elsevier Patient Education  2020 [employer].

## 2020-04-17 ENCOUNTER — Encounter: Payer: Self-pay | Admitting: Internal Medicine

## 2020-04-17 DIAGNOSIS — M542 Cervicalgia: Secondary | ICD-10-CM

## 2020-05-29 ENCOUNTER — Other Ambulatory Visit: Payer: Self-pay | Admitting: Internal Medicine

## 2020-07-04 ENCOUNTER — Ambulatory Visit: Payer: Managed Care, Other (non HMO)

## 2020-07-25 ENCOUNTER — Other Ambulatory Visit: Payer: Self-pay | Admitting: Internal Medicine

## 2020-07-25 NOTE — Telephone Encounter (Signed)
Refilled by Dr Regis Bill.  Okay to fill?

## 2020-08-08 ENCOUNTER — Ambulatory Visit: Payer: Managed Care, Other (non HMO) | Attending: Internal Medicine

## 2020-08-08 ENCOUNTER — Other Ambulatory Visit: Payer: Self-pay

## 2020-08-08 DIAGNOSIS — R252 Cramp and spasm: Secondary | ICD-10-CM

## 2020-08-08 DIAGNOSIS — M542 Cervicalgia: Secondary | ICD-10-CM | POA: Diagnosis not present

## 2020-08-08 DIAGNOSIS — R293 Abnormal posture: Secondary | ICD-10-CM

## 2020-08-08 NOTE — Patient Instructions (Addendum)
Trigger Point Dry Needling  . What is Trigger Point Dry Needling (DN)? o DN is a physical therapy technique used to treat muscle pain and dysfunction. Specifically, DN helps deactivate muscle trigger points (muscle knots).  o A thin filiform needle is used to penetrate the skin and stimulate the underlying trigger point. The goal is for a local twitch response (LTR) to occur and for the trigger point to relax. No medication of any kind is injected during the procedure.   . What Does Trigger Point Dry Needling Feel Like?  o The procedure feels different for each individual patient. Some patients report that they do not actually feel the needle enter the skin and overall the process is not painful. Very mild bleeding may occur. However, many patients feel a deep cramping in the muscle in which the needle was inserted. This is the local twitch response.   Marland Kitchen How Will I feel after the treatment? o Soreness is normal, and the onset of soreness may not occur for a few hours. Typically this soreness does not last longer than two days.  o Bruising is uncommon, however; ice can be used to decrease any possible bruising.  o In rare cases feeling tired or nauseous after the treatment is normal. In addition, your symptoms may get worse before they get better, this period will typically not last longer than 24 hours.   . What Can I do After My Treatment? o Increase your hydration by drinking more water for the next 24 hours. o You may place ice or heat on the areas treated that have become sore, however, do not use heat on inflamed or bruised areas. Heat often brings more relief post needling. o You can continue your regular activities, but vigorous activity is not recommended initially after the treatment for 24 hours. o DN is best combined with other physical therapy such as strengthening, stretching, and other therapies.   Access Code: [email or web address] URL: [email or web address] Date:  08/08/2020 Prepared by: Claiborne Billings  Exercises Seated Cervical Flexion AROM - 3 x daily - 7 x weekly - 1 sets - 3 reps - 20 hold Seated Cervical Sidebending AROM - 3 x daily - 7 x weekly - 1 sets - 3 reps - 20 hold Seated Cervical Rotation AROM - 3 x daily - 7 x weekly - 1 sets - 3 reps - 20 hold Seated Correct Posture - 1 x daily - 7 x weekly - 3 sets - 10 reps Sidelying Open Book Thoracic Rotation with Knee on Foam Roll - 3 x daily - 7 x weekly - 1 sets - 10 reps  [facility] [address] [place], [place] [address] Phone # [phone] Fax [phone]

## 2020-08-08 NOTE — Therapy (Signed)
[facility] [address], [place], [place], [address] Phone: [phone]   Fax:  [phone]  Physical Therapy Evaluation  Patient Details  Name: Michael Stark MRN: [id number] Date of Birth: 21-Apr-1981 Referring Provider (PT): Lelon Frohlich, MD   Encounter Date: 08/08/2020   PT End of Session - 08/08/20 1059    Visit Number 1    Date for PT Re-Evaluation 10/03/20    Authorization Type [employer]    PT Start Time 1021    PT Stop Time 1100    PT Time Calculation (min) 39 min    Activity Tolerance Patient tolerated treatment well    Behavior During Therapy [facility] for tasks assessed/performed           Past Medical History:  Diagnosis Date  . 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 Seizures ([place])     Past Surgical History:  Procedure Laterality Date  . APPENDECTOMY    . FOOT CAPSULE RELEASE W/ PERCUTANEOUS HEEL CORD LENGTHENING, TIBIAL TENDON TRANSFER    . heal lengthening      There were no vitals filed for this visit.    Subjective Assessment - 08/08/20 1023    Subjective Pt presents to PT with Lt sided neck pain with > 1 year history.  Pt reports that he had a fall ~2 years ago and had x-ray and was told he had the begining cervical DDD.    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  Now resultant Lt UE/LE weakness and assymmetries- mild.    Diagnostic tests none recent    Patient Stated Goals reduce neck pain, improve flexibility    Currently in Pain? Yes    Pain Score 2     Pain Location Neck    Pain Orientation Left    Pain Descriptors / Indicators Tightness;Sore    Pain Onset More than a month ago    Pain Frequency Intermittent    Aggravating Factors  desk work, not sitting with good posture    Pain Relieving Factors sitting with good alignment, stretching              [facility] PT  Assessment - 08/08/20 0001      Assessment   Medical Diagnosis neck pain    Referring Provider (PT) Lelon Frohlich, MD    Onset Date/Surgical Date 08/04/18    Hand Dominance Right    Prior Therapy none      Precautions   Precautions Other (comment)    Precaution Comments siezure disorder- none since 2014      Restrictions   Weight Bearing Restrictions No      Balance Screen   Has the patient fallen in the past 6 months No    Has the patient had a decrease in activity level because of a fear of falling?  No    Is the patient reluctant to leave their home because of a fear of falling?  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  Lt cervical "stiffness" reported at end range motion in all directions      Strength   Overall Strength Within functional limits for tasks performed    Overall Strength Comments Rt=Lt      Palpation   Spinal mobility reduced spinal mobility with PA mobs C3-6 with soreness reported by pt.    Palpation comment bil tension and trigger points Rt and Lt UT, subocciptals and cervical paraspinals      Special Tests    Special Tests Cervical    Cervical Tests Dictraction      Distraction Test   Findngs Negative       Transfers   Transfers Independent with all Transfers      Ambulation/Gait   Ambulation/Gait Yes    Gait Pattern Within Functional Limits                      Objective measurements completed on examination: See above findings.               PT Education - 08/08/20 1046    Education Details DN, Access Code: [email or web address]    Person(s) Educated Patient    Methods Explanation;Demonstration;Handout    Comprehension Verbalized understanding;Returned demonstration            PT Short Term Goals - 08/08/20 1106      PT SHORT TERM GOAL #1   Title be independent in initial HEP    Status New    Target Date 09/05/20      PT SHORT TERM GOAL #2   Title report a 30% reduction in neck stiffness with driving and ADLs    Time 4    Period Weeks    Target Date 09/05/20             PT Long Term Goals - 08/08/20 1037      PT LONG TERM GOAL #1   Title be indpendent in advanced HEP    Time 8    Period Weeks    Status New    Target Date 10/03/20      PT LONG TERM GOAL #2   Title verbalize and demonstrate body mechanics modifications for cervical protection with daily tasks    Time 8    Period Weeks    Status New    Target Date 10/03/20      PT LONG TERM GOAL #3   Title report a 60% reduction in neck pain with turning head with driving    Time 8    Period Weeks    Status New    Target Date 10/03/20                  Plan - 08/08/20 1142    Clinical Impression Statement Pt presents to PT with Lt sided neck pain with pain turning head to the Lt. This pain began ~2 years ago.  Pt has history of Lt sided hemiparesis on the Lt since age 40 with muscle atrophy, muscle length deficits and  limb length deficits on the Lt.  Underlying cause is due to an issue in the Rt motor cortex.  Pt reports stiffness and soreness in the Lt neck and rates this pain as 2/10 today.  Pt reports that pain worsens with desk work especially when not sitting with good posture.   Pain is reduced when sitting erect and with use of heat.  Pt demonstrates full and Rt=Lt cervical and UE A/ROM and strength with Lt neck pain reported at end range of all cervical A/ROM.  Pt with reduced cervical segmental mobility and tension/tenderness in bil cervical paraspinals, suboccipitals and upper traps.  Pt will benefit from skilled PT to address cervical mobility and reduce tension, pain and improve postural strength.    Personal Factors and Comorbidities Comorbidity 1    Comorbidities Lt sided hemiparesis/weakness    Examination-Activity Limitations Sit    Examination-Participation Restrictions Driving    Stability/Clinical Decision Making Stable/Uncomplicated    Clinical Decision Making Low    Rehab Potential Excellent    PT Frequency 1x / week    PT Duration 8 weeks    PT Treatment/Interventions ADLs/Self Care Home Management;Cryotherapy;Electrical Stimulation;Traction;Moist Heat;Therapeutic activities;Therapeutic exercise;Neuromuscular re-education;Patient/family education;Manual techniques;Taping;Dry needling;Joint Manipulations;Spinal Manipulations    PT Next Visit Plan dry needling, spinal mobs, postural strength, reveiw HEP    PT Home Exercise Plan Access Code: [email or web address]    Consulted and Agree with Plan of Care Patient           Patient will benefit from skilled therapeutic intervention in order to improve the following deficits and impairments:  Impaired flexibility,Pain,Postural dysfunction,Hypomobility  Visit Diagnosis: Cervicalgia - Plan: PT plan of care cert/re-cert  Cramp and spasm - Plan: PT plan of care cert/re-cert  Abnormal posture - Plan: PT plan of care cert/re-cert     Problem List Patient Active Problem List   Diagnosis Date Noted  . Hyperlipidemia 06/24/2018  . Left hemiparesis ([place]) 06/22/2018  . ADHD 06/22/2018  . Paronychia of toe of left foot 06/11/2015  . Cough 06/11/2015  . Irregular heart beats 12/19/2014  . Difficulty concentrating  12/19/2014  . Preventative health care 12/18/2014  . 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 [place], [place], [address] Phone: [phone]   Fax:  [phone]  Name: Michael Stark MRN: [id number] Date of Birth: [birth year]

## 2020-08-12 ENCOUNTER — Other Ambulatory Visit: Payer: Self-pay | Admitting: Internal Medicine

## 2020-08-20 ENCOUNTER — Other Ambulatory Visit: Payer: Self-pay

## 2020-08-20 ENCOUNTER — Encounter: Payer: Self-pay | Admitting: Physical Therapy

## 2020-08-20 ENCOUNTER — Ambulatory Visit: Payer: Managed Care, Other (non HMO) | Admitting: Physical Therapy

## 2020-08-20 DIAGNOSIS — M542 Cervicalgia: Secondary | ICD-10-CM | POA: Diagnosis not present

## 2020-08-20 DIAGNOSIS — R252 Cramp and spasm: Secondary | ICD-10-CM

## 2020-08-20 DIAGNOSIS — R293 Abnormal posture: Secondary | ICD-10-CM

## 2020-08-20 NOTE — Therapy (Signed)
[facility] [address], [place], [place], [address] Phone: [phone]   Fax:  [phone]  Physical Therapy Treatment  Patient Details  Name: Michael Stark MRN: [id number] Date of Birth: 12/27/1980 Referring Provider (PT): Lelon Frohlich, MD   Encounter Date: 08/20/2020   PT End of Session - 08/20/20 1656    Visit Number 2    Date for PT Re-Evaluation 10/03/20    Authorization Type [employer]    PT Start Time 1615    PT Stop Time 1704    PT Time Calculation (min) 49 min    Activity Tolerance Patient tolerated treatment well    Behavior During Therapy [facility] for tasks assessed/performed           Past Medical History:  Diagnosis Date  . 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 Seizures ([place])     Past Surgical History:  Procedure Laterality Date  . APPENDECTOMY    . FOOT CAPSULE RELEASE W/ PERCUTANEOUS HEEL CORD LENGTHENING, TIBIAL TENDON TRANSFER    . heal lengthening      There were no vitals filed for this visit.   Subjective Assessment - 08/20/20 1616    Subjective Pt reports he went to the beach and dug a large hole in sand for his kids and had deep ache in base of neck where it meets the shoulders.  Better today.    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  Now resultant Lt UE/LE weakness and assymmetries- mild.    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    Pain Relieving Factors sitting with good posture, stretching                             [facility] Adult PT Treatment/Exercise  - 08/20/20 0001      Self-Care   Self-Care Other Self-Care Comments    Other Self-Care Comments  DN aftercare      Exercises   Exercises Neck;Shoulder      Neck Exercises: Seated   Other Seated Exercise 1 rep each x 20 sec: bil SB, bil Rot, neck flexion      Modalities   Modalities Moist Heat      Moist Heat Therapy   Number Minutes Moist Heat 10 Minutes    Moist Heat Location Cervical   Rt upper trap post/ant     Manual Therapy   Manual Therapy Soft tissue mobilization;Passive ROM;Joint mobilization    Joint Mobilization rib springing bil Gr II/III, thoracic PAs Gr II/III T1-T3    Soft tissue mobilization Rt upper trap, SO after DN    Passive ROM bil cervical Rot, passive Rt upper trap stretch            Trigger Point Dry Needling - 08/20/20 0001    Consent Given? Yes    Education Handout Provided Previously provided    Muscles Treated Head and Neck Suboccipitals;Upper trapezius;Cervical multifidi    Upper Trapezius Response Twitch reponse elicited;Palpable increased muscle  length   Rt>Lt   Suboccipitals Response Palpable increased muscle length    Cervical multifidi Response Palpable increased muscle length                  PT Short Term Goals - 08/20/20 1704      PT SHORT TERM GOAL #1   Title be independent in initial HEP    Status Achieved             PT Long Term Goals - 08/08/20 1037      PT LONG TERM GOAL #1   Title be indpendent in advanced HEP    Time 8    Period Weeks    Status New    Target Date 10/03/20      PT LONG TERM GOAL #2   Title verbalize and demonstrate body mechanics modifications for cervical protection with daily tasks    Time 8    Period Weeks    Status New    Target Date 10/03/20      PT LONG TERM GOAL #3   Title report a 60% reduction in neck pain with turning head with driving    Time 8    Period Weeks    Status New    Target Date 10/03/20                 Plan - 08/20/20 1657    Clinical Impression  Statement Pt arrived with 1/10 having had increased pain yesterday that has since calmed down.  He had base of the neck pain (where c-spine meets t-spine) following digging a large hole in the sand at the beach over the weekend.  He has been compliant with intitial HEP.  He declined doing postural strength as proposed by PT today secondary to ongoing healing of Lt chest tattoo he got last week.  Pt had signif Rt>Lt spasm with trigger points in upper trap, cervical paraspinals and Rt SO today which released well today with DN.  Signif twitch and release Rt upper trap today with soreness end of session. Heat used and PT encouraged more heat over next 1-2 days for ongoing soreness.  PT encouraged continued committment to ROM and stretching.  He will benefit from postural strength next visit if Pt is ready (tattoo).    Comorbidities Lt sided hemiparesis/weakness    PT Frequency 1x / week    PT Duration 8 weeks    PT Treatment/Interventions ADLs/Self Care Home Management;Cryotherapy;Electrical Stimulation;Traction;Moist Heat;Therapeutic activities;Therapeutic exercise;Neuromuscular re-education;Patient/family education;Manual techniques;Taping;Dry needling;Joint Manipulations;Spinal Manipulations    PT Next Visit Plan f/u on DN #1, repeat if needed, spine mobs, postural strength if tattoo is healed, try foam roller for thoracic mobility    PT Home Exercise Plan Access Code: [email or web address]    Consulted and Agree with Plan of Care Patient           Patient will benefit from skilled therapeutic intervention in order to improve the following deficits and impairments:     Visit Diagnosis: Cervicalgia  Cramp and spasm  Abnormal posture     Problem List Patient Active Problem List   Diagnosis Date Noted  . Hyperlipidemia 06/24/2018  . Left hemiparesis ([place]) 06/22/2018  . ADHD 06/22/2018  . Paronychia of toe of left foot 42/14/2017  . Cough 06/11/2015  . Irregular heart beats 12/19/2014  . Difficulty  concentrating 12/19/2014  . Preventative health care 12/18/2014  . 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, [place], [place], [address] Phone: [phone]   Fax:  [phone]  Name: Michael Stark MRN: [id number] Date of Birth: 05-21-1980

## 2020-08-28 ENCOUNTER — Ambulatory Visit: Payer: Managed Care, Other (non HMO) | Attending: Internal Medicine | Admitting: Physical Therapy

## 2020-08-28 ENCOUNTER — Other Ambulatory Visit: Payer: Self-pay

## 2020-08-28 ENCOUNTER — Other Ambulatory Visit: Payer: Self-pay | Admitting: Internal Medicine

## 2020-08-28 DIAGNOSIS — R293 Abnormal posture: Secondary | ICD-10-CM

## 2020-08-28 DIAGNOSIS — R252 Cramp and spasm: Secondary | ICD-10-CM | POA: Diagnosis present

## 2020-08-28 DIAGNOSIS — M542 Cervicalgia: Secondary | ICD-10-CM | POA: Diagnosis not present

## 2020-08-28 NOTE — Therapy (Signed)
[facility] [address] [place], [place], [address] Phone: [phone]   Fax:  [phone]  Physical Therapy Treatment  Patient Details  Name: Michael Stark MRN: [id number] Date of Birth: 04-03-1981 Referring Provider (PT): Lelon Frohlich, MD   Encounter Date: 08/28/2020   PT End of Session - 08/28/20 1732    Visit Number 3    Date for PT Re-Evaluation 10/03/20    Authorization Type [employer]    PT Start Time 0623    PT Stop Time 1659    PT Time Calculation (min) 42 min    Activity Tolerance Patient tolerated treatment well    Behavior During Therapy [facility] for tasks assessed/performed           Past Medical History:  Diagnosis Date  . 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 Seizures ([place])     Past Surgical History:  Procedure Laterality Date  . APPENDECTOMY    . FOOT CAPSULE RELEASE W/ PERCUTANEOUS HEEL CORD LENGTHENING, TIBIAL TENDON TRANSFER    . heal lengthening      There were no vitals filed for this visit.   Subjective Assessment - 08/28/20 1618    Subjective "I have stiffness but that's usual"    Pertinent History Lt hemiparesis/muscle atrophy- age 24 due to underlying condition in the Rt motor cortex.  Now resultant Lt UE/LE weakness and assymmetries- mild.    Diagnostic tests none recent    Patient Stated Goals reduce neck pain, improve flexibility    Currently in Pain? No/denies                             [facility] Adult PT Treatment/Exercise - 08/28/20 0001      Neck Exercises: Theraband   Horizontal ABduction 15 reps    Horizontal ABduction Limitations seated; red tband      Neck Exercises: Supine   Neck Retraction 10 reps      Shoulder Exercises: Seated   External Rotation 15 reps    Theraband Level (Shoulder External Rotation) Level 2 (Red)      Shoulder Exercises: Stretch   Other Shoulder Stretches doorway pec stretch 3  x 10s hold    Other Shoulder Stretches open book x5 repetitions each way      Modalities   Modalities Moist Heat      Moist Heat Therapy   Number Minutes Moist Heat 5 Minutes    Moist Heat Location Cervical      Manual Therapy   Manual Therapy Soft tissue mobilization    Soft tissue mobilization skilled palpation and assessment of tissue before, during, and after DN            Trigger Point Dry Needling - 08/28/20 0001    Consent Given? Yes    Education Handout Provided Previously provided    Muscles Treated Head and Neck Upper trapezius;Levator scapulae;Cervical multifidi    Upper Trapezius Response Twitch reponse elicited;Palpable increased muscle length    Levator Scapulae Response Twitch response elicited    Cervical multifidi Response Palpable increased muscle length                  PT Short Term Goals - 08/28/20 1621      PT SHORT TERM GOAL #2   Title report a 30% reduction in neck stiffness with driving and ADLs    Time 4    Period Weeks  Status On-going   "feels about the same"   Target Date 09/05/20             PT Long Term Goals - 08/08/20 1037      PT LONG TERM GOAL #1   Title be indpendent in advanced HEP    Time 8    Period Weeks    Status New    Target Date 10/03/20      PT LONG TERM GOAL #2   Title verbalize and demonstrate body mechanics modifications for cervical protection with daily tasks    Time 8    Period Weeks    Status New    Target Date 10/03/20      PT LONG TERM GOAL #3   Title report a 60% reduction in neck pain with turning head with driving    Time 8    Period Weeks    Status New    Target Date 10/03/20                 Plan - 08/28/20 1723    Clinical Impression Statement Patient reporting "clicking" of sternoclavicular joint bilaterally while performing horizontal abduction. Therapist palpating decreased clicking with tactile cues for decreased scapular elevation. Twitch response elicited with DN to Rt  upper trap. Patient reporting soreness at end of session. Heat applied following DN to facilitate decreased soreness.    Personal Factors and Comorbidities Comorbidity 1    Comorbidities Lt sided hemiparesis/weakness    Examination-Activity Limitations Sit    Examination-Participation Restrictions Driving    Rehab Potential Excellent    PT Frequency 1x / week    PT Duration 8 weeks    PT Treatment/Interventions ADLs/Self Care Home Management;Cryotherapy;Electrical Stimulation;Traction;Moist Heat;Therapeutic activities;Therapeutic exercise;Neuromuscular re-education;Patient/family education;Manual techniques;Taping;Dry needling;Joint Manipulations;Spinal Manipulations    PT Next Visit Plan assess response to DN #2; continue manual and postural strengthening    PT Home Exercise Plan Access Code: [email or web address]    Consulted and Agree with Plan of Care Patient           Patient will benefit from skilled therapeutic intervention in order to improve the following deficits and impairments:  Impaired flexibility,Pain,Postural dysfunction,Hypomobility  Visit Diagnosis: Cervicalgia  Cramp and spasm  Abnormal posture     Problem List Patient Active Problem List   Diagnosis Date Noted  . Hyperlipidemia 06/24/2018  . Left hemiparesis ([place]) 06/22/2018  . ADHD 06/22/2018  . Paronychia of toe of left foot 06/11/2015  . Cough 06/11/2015  . Irregular heart beats 12/19/2014  . Difficulty concentrating 12/19/2014  . Preventative health care 12/18/2014  . 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, [place], [place], [address] Phone: [phone]   Fax:  [phone]  Name: Orren Pietsch MRN: [id number] Date of Birth: 1980/09/22

## 2020-09-04 ENCOUNTER — Other Ambulatory Visit: Payer: Self-pay

## 2020-09-04 ENCOUNTER — Ambulatory Visit: Payer: Managed Care, Other (non HMO)

## 2020-09-04 DIAGNOSIS — M542 Cervicalgia: Secondary | ICD-10-CM | POA: Diagnosis not present

## 2020-09-04 DIAGNOSIS — R293 Abnormal posture: Secondary | ICD-10-CM

## 2020-09-04 DIAGNOSIS — R252 Cramp and spasm: Secondary | ICD-10-CM

## 2020-09-04 NOTE — Therapy (Signed)
[facility] [address], [place], [place], [address] Phone: [phone]   Fax:  [phone]  Physical Therapy Treatment  Patient Details  Name: Michael Stark MRN: [id number] Date of Birth: [birth year] Referring Provider (PT): Lelon Frohlich, MD   Encounter Date: 09/04/2020   PT End of Session - 09/04/20 1652    Visit Number 4    Date for PT Re-Evaluation 10/03/20    Authorization Type [employer]    PT Start Time 1614   dry needling   PT Stop Time 1651    PT Time Calculation (min) 37 min    Activity Tolerance Patient tolerated treatment well    Behavior During Therapy [facility] for tasks assessed/performed           Past Medical History:  Diagnosis Date  . 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 Seizures ([place])     Past Surgical History:  Procedure Laterality Date  . APPENDECTOMY    . FOOT CAPSULE RELEASE W/ PERCUTANEOUS HEEL CORD LENGTHENING, TIBIAL TENDON TRANSFER    . heal lengthening      There were no vitals filed for this visit.   Subjective Assessment - 09/04/20 1619    Subjective I am hanging in there, end of the year at work so very busy.  I am doing better, 20-25% better overall.    Pertinent History Lt hemiparesis/muscle atrophy- age 57 due to underlying condition in the Rt motor cortex.  Now resultant Lt UE/LE weakness and assymmetries- mild.    Currently in Pain? No/denies                             [facility] Adult PT Treatment/Exercise - 09/04/20 0001      Neck Exercises: Theraband   Shoulder External Rotation 20 reps;Green    Horizontal ABduction 20 reps;Green    Other Theraband Exercises D2 green 2x10      Manual Therapy   Manual Therapy Soft tissue mobilization    Manual therapy comments elongation and release to bil neck and upper traps    Soft tissue mobilization skilled palpation and assessment of tissue before, during, and after  DN            Trigger Point Dry Needling - 09/04/20 0001    Consent Given? Yes    Education Handout Provided Previously provided    Muscles Treated Head and Neck Upper trapezius;Levator scapulae;Cervical multifidi    Upper Trapezius Response Twitch reponse elicited;Palpable increased muscle length    Levator Scapulae Response Twitch response elicited    Cervical multifidi Response Palpable increased muscle length                PT Education - 09/04/20 1627    Education Details Access Code: [email or web address]    Person(s) Educated Patient    Methods Explanation;Demonstration;Handout    Comprehension Verbalized understanding;Returned demonstration            PT Short Term Goals - 08/28/20 1621      PT SHORT TERM GOAL #2   Title report a 30% reduction in neck stiffness with driving and ADLs    Time 4    Period Weeks    Status On-going   "feels about the same"   Target Date 09/05/20             PT Long Term Goals - 08/08/20 1037      PT  LONG TERM GOAL #1   Title be indpendent in advanced HEP    Time 8    Period Weeks    Status New    Target Date 10/03/20      PT LONG TERM GOAL #2   Title verbalize and demonstrate body mechanics modifications for cervical protection with daily tasks    Time 8    Period Weeks    Status New    Target Date 10/03/20      PT LONG TERM GOAL #3   Title report a 60% reduction in neck pain with turning head with driving    Time 8    Period Weeks    Status New    Target Date 10/03/20                 Plan - 09/04/20 1654    Clinical Impression Statement Pt reports 20-25% overall improvement in symptoms since the start of care.  Pt is independent in HEP for flexibility and postural adjustments.  PT added HEP for postural strength.  Pt required intermittent tactile cues for alignment and to reduce scapular elevation with seated exercises.  Pt with tension and trigger points in bil neck and upper traps/levator.  Good response to dry  needling with twitch and improved tissue and segmental mobility.  Pt will continue to benefit from skilled PT to address neck pain, tension and postural strength.    PT Frequency 1x / week    PT Duration 8 weeks    PT Treatment/Interventions ADLs/Self Care Home Management;Cryotherapy;Electrical Stimulation;Traction;Moist Heat;Therapeutic activities;Therapeutic exercise;Neuromuscular re-education;Patient/family education;Manual techniques;Taping;Dry needling;Joint Manipulations;Spinal Manipulations    PT Next Visit Plan assess response to DN #3; continue manual and postural strengthening. Pt leaving to go out of country at the end of the month.  Probable dC at that time.    PT Home Exercise Plan Access Code: [email or web address]    Recommended Other Services initial cert is sign           Patient will benefit from skilled therapeutic intervention in order to improve the following deficits and impairments:  Impaired flexibility,Pain,Postural dysfunction,Hypomobility  Visit Diagnosis: Cervicalgia  Cramp and spasm  Abnormal posture     Problem List Patient Active Problem List   Diagnosis Date Noted  . Hyperlipidemia 06/24/2018  . Left hemiparesis ([place]) 06/22/2018  . ADHD 06/22/2018  . Paronychia of toe of left foot 06/11/2015  . Cough 06/11/2015  . Irregular heart beats 12/19/2014  . Difficulty concentrating 12/19/2014  . Preventative health care 12/18/2014  . 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, [place], [place], [address] Phone: [phone]   Fax:  [phone]  Name: Cobain Morici MRN: [id number] Date of Birth: 07/20/1980

## 2020-09-04 NOTE — Patient Instructions (Signed)
Access Code: [email or web address] URL: [email or web address] Date: 09/04/2020 Prepared by: Claiborne Billings   Seated Shoulder W External Rotation on Swiss Ball - 2 x daily - 7 x weekly - 2 sets - 10 reps Seated Shoulder Horizontal Abduction with Resistance - Palms Down - 2 x daily - 7 x weekly - 2 sets - 10 reps Standing Shoulder Single Arm PNF D2 Flexion with Resistance - 2 x daily - 7 x weekly - 2 sets - 10 reps

## 2020-09-11 ENCOUNTER — Ambulatory Visit: Payer: Managed Care, Other (non HMO)

## 2020-09-11 ENCOUNTER — Other Ambulatory Visit: Payer: Self-pay | Admitting: Internal Medicine

## 2020-09-11 ENCOUNTER — Other Ambulatory Visit: Payer: Self-pay

## 2020-09-11 DIAGNOSIS — M542 Cervicalgia: Secondary | ICD-10-CM

## 2020-09-11 DIAGNOSIS — R293 Abnormal posture: Secondary | ICD-10-CM

## 2020-09-11 DIAGNOSIS — R252 Cramp and spasm: Secondary | ICD-10-CM

## 2020-09-11 NOTE — Therapy (Signed)
[facility] [address], [place], [place], [address] Phone: [phone]   Fax:  [phone]  Physical Therapy Treatment  Patient Details  Name: Michael Stark MRN: [id number] Date of Birth: 1980-08-25 Referring Provider (PT): Lelon Frohlich, MD   Encounter Date: 09/11/2020   PT End of Session - 09/11/20 1700    Visit Number 5    Date for PT Re-Evaluation 10/03/20    Authorization Type [employer]    PT Start Time 1616    PT Stop Time 1700    PT Time Calculation (min) 44 min    Activity Tolerance Patient tolerated treatment well    Behavior During Therapy [facility] for tasks assessed/performed           Past Medical History:  Diagnosis Date  . 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 Seizures ([place])     Past Surgical History:  Procedure Laterality Date  . APPENDECTOMY    . FOOT CAPSULE RELEASE W/ PERCUTANEOUS HEEL CORD LENGTHENING, TIBIAL TENDON TRANSFER    . heal lengthening      There were no vitals filed for this visit.   Subjective Assessment - 09/11/20 1618    Subjective The last days have been weird-I've been tight where my cervical spine meets thoracic.  Otherwise doing well.    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  Now resultant Lt UE/LE weakness and assymmetries- mild.    Patient Stated Goals reduce neck pain, improve flexibility    Currently in Pain? Yes    Pain Location Neck    Pain Orientation Left;Right                             [facility] Adult PT Treatment/Exercise - 09/11/20 0001      Neck Exercises: Theraband   Shoulder External Rotation 20 reps;Green    Horizontal ABduction 20 reps;Green    Other Theraband Exercises D2 green 2x10      Manual Therapy   Manual Therapy Soft tissue mobilization    Manual therapy comments elongation and release to bil neck and upper  traps    Soft tissue mobilization skilled palpation and assessment of tissue before, during, and after DN            Trigger Point Dry Needling - 09/11/20 0001    Consent Given? Yes    Education Handout Provided Previously provided    Muscles Treated Head and Neck Upper trapezius;Levator scapulae;Cervical multifidi    Other Dry Needling thoracic multifidi T1-4 bil    Upper Trapezius Response Twitch reponse elicited;Palpable increased muscle length    Levator Scapulae Response Twitch response elicited    Cervical multifidi Response Palpable increased muscle length                  PT Short Term Goals - 08/28/20 1621      PT SHORT TERM GOAL #2   Title report a 30% reduction in neck stiffness with driving and ADLs    Time 4    Period Weeks    Status On-going   "feels about the same"   Target Date 09/05/20             PT Long Term Goals - 08/08/20 1037      PT LONG TERM GOAL #1   Title be indpendent in advanced HEP  Time 8    Period Weeks    Status New    Target Date 10/03/20      PT LONG TERM GOAL #2   Title verbalize and demonstrate body mechanics modifications for cervical protection with daily tasks    Time 8    Period Weeks    Status New    Target Date 10/03/20      PT LONG TERM GOAL #3   Title report a 60% reduction in neck pain with turning head with driving    Time 8    Period Weeks    Status New    Target Date 10/03/20                 Plan - 09/11/20 1629    Clinical Impression Statement Pt continues to report 20-25% overall improvement in symptoms since the start of care. Pt has had cervicothoracic stiffness x 3 days this week with unknown cause. Pt is independent in HEP for flexibility and postural adjustments.  Pt is doing HEP for postural strength and flexibility. Pt required intermittent tactile cues for alignment and to reduce scapular elevation with seated exercises.  Pt with tension and trigger points in bil neck and upper  traps/levator.  Good response to dry needling with twitch and improved tissue and segmental mobility.  Pt will continue to benefit from skilled PT to address neck pain, tension and postural strength.    Rehab Potential Excellent    PT Frequency 1x / week    PT Duration 8 weeks    PT Treatment/Interventions ADLs/Self Care Home Management;Cryotherapy;Electrical Stimulation;Traction;Moist Heat;Therapeutic activities;Therapeutic exercise;Neuromuscular re-education;Patient/family education;Manual techniques;Taping;Dry needling;Joint Manipulations;Spinal Manipulations    PT Next Visit Plan assess response to DN #4; continue manual and postural strengthening. Pt leaving to go out of country at the end of the month.  Pt will be placed on hold and ERO performed after trip.    PT Home Exercise Plan Access Code: [email or web address]    Consulted and Agree with Plan of Care Patient           Patient will benefit from skilled therapeutic intervention in order to improve the following deficits and impairments:  Impaired flexibility,Pain,Postural dysfunction,Hypomobility  Visit Diagnosis: Cervicalgia  Cramp and spasm  Abnormal posture     Problem List Patient Active Problem List   Diagnosis Date Noted  . Hyperlipidemia 06/24/2018  . Left hemiparesis ([place]) 06/22/2018  . ADHD 06/22/2018  . Paronychia of toe of left foot 06/11/2015  . Cough 06/11/2015  . Irregular heart beats 12/19/2014  . Difficulty concentrating 12/19/2014  . Preventative health care 12/18/2014  . 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 [place], [place], [address] Phone: [phone]   Fax:  [phone]  Name: Michael Stark MRN: [id number] Date of Birth: 42-Jun-1982

## 2020-10-29 ENCOUNTER — Ambulatory Visit: Payer: Managed Care, Other (non HMO) | Attending: Internal Medicine | Admitting: Physical Therapy

## 2020-10-29 ENCOUNTER — Encounter: Payer: Self-pay | Admitting: Physical Therapy

## 2020-10-29 ENCOUNTER — Other Ambulatory Visit: Payer: Self-pay

## 2020-10-29 DIAGNOSIS — R293 Abnormal posture: Secondary | ICD-10-CM | POA: Diagnosis present

## 2020-10-29 DIAGNOSIS — R252 Cramp and spasm: Secondary | ICD-10-CM | POA: Diagnosis present

## 2020-10-29 DIAGNOSIS — M542 Cervicalgia: Secondary | ICD-10-CM | POA: Diagnosis not present

## 2020-10-29 NOTE — Therapy (Signed)
[facility] [address], [place], [place], [address] Phone: [phone]   Fax:  [phone]  Physical Therapy Treatment  Patient Details  Name: Michael Stark MRN: [id number] Date of Birth: 1980-10-28 Referring Provider (PT): Lelon Frohlich, MD   Encounter Date: 10/29/2020   PT End of Session - 10/29/20 1615     Visit Number 6    Date for PT Re-Evaluation 12/10/20    Authorization Type [employer]    PT Start Time 1617    PT Stop Time 1710    PT Time Calculation (min) 53 min    Activity Tolerance Patient tolerated treatment well    Behavior During Therapy [facility] for tasks assessed/performed             Past Medical History:  Diagnosis Date   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   Subjective Assessment - 10/29/20 1615     Subjective I have traveled overseas since last visit and just got back.  Before I traveled I was improved but have regressed somewhat.  I have return of tingling in Lt neck.    Pertinent History Lt hemiparesis/muscle atrophy- age 7 due to underlying condition in the Rt motor cortex.  Now resultant Lt UE/LE weakness and assymmetries- mild.    Diagnostic tests none recent    Patient Stated Goals reduce neck pain, improve flexibility    Currently in Pain? Yes    Pain Score 2     Pain Location Neck    Pain Orientation Left    Pain Descriptors / Indicators Tightness;Nagging    Pain Type Chronic pain    Pain Onset More than a month ago    Pain Frequency Intermittent    Aggravating Factors  desk work, sitting with bad posture    Pain Relieving Factors stretching, good posture, DN                [facility] PT  Assessment - 10/29/20 0001       Assessment   Medical Diagnosis neck pain    Referring Provider (PT) Lelon Frohlich, MD    Onset Date/Surgical Date 08/04/18    Hand Dominance Right    Prior Therapy none      Precautions   Precautions Other (comment)    Precaution Comments siezure disorder- none since 2014      AROM   AROM Assessment Site Cervical    Cervical Flexion 50    Cervical Extension 70    Cervical - Right Side Bend 40    Cervical - Left Side Bend 50    Cervical - Right Rotation 60    Cervical - Left Rotation 65      Strength   Overall Strength Within functional limits for tasks performed    Overall Strength Comments Rt=Lt      Palpation   Spinal mobility reduced PAs C4-T4, Rt costovertebral joint T3/4    Palpation comment trigger points present bil upper trap Lt>Rt                           [facility] Adult PT Treatment/Exercise - 10/29/20 0001  Moist Heat Therapy   Number Minutes Moist Heat 10 Minutes    Moist Heat Location Cervical      Manual Therapy   Manual Therapy Soft tissue mobilization;Joint mobilization    Manual therapy comments elongation and release to bil neck and upper traps    Joint Mobilization thoracic PAs Gr III/IV T1-T4, Gr V Rt costovertebral joint T3/4    Soft tissue mobilization skilled palpation and assessment of tissue before, during, and after DN              Trigger Point Dry Needling - 10/29/20 0001     Consent Given? Yes    Education Handout Provided Previously provided    Muscles Treated Head and Neck Upper trapezius;Cervical multifidi    Dry Needling Comments Lt upper trap anterior and posterior - signif twitch and release both ant/post    Upper Trapezius Response Twitch reponse elicited;Palpable increased muscle length    Cervical multifidi Response Palpable increased muscle length                    PT Short Term Goals - 10/29/20 1708       PT SHORT TERM GOAL #1   Title be  independent in initial HEP    Status Achieved      PT SHORT TERM GOAL #2   Title report a 30% reduction in neck stiffness with driving and ADLs    Baseline regressed traveling    Status Partially Met               PT Long Term Goals - 10/29/20 1708       PT LONG TERM GOAL #1   Title be indpendent in advanced HEP    Status On-going      PT LONG TERM GOAL #2   Title verbalize and demonstrate body mechanics modifications for cervical protection with daily tasks    Status On-going      PT LONG TERM GOAL #3   Title report a 60% reduction in neck pain with turning head with driving    Baseline 97% improved before traveling, regressed to 20% improvement    Status On-going                   Plan - 10/29/20 1710     Clinical Impression Statement Pt returns to the clinic following a 1.5 month treatment gap due to overseas travel.  Pt reports regression of progress following traveling.  He reports return of stiffness and nagging tightness/numbness in Lt cervicothoracic region which he states usually preceeds flare up of increased pain.  Current pain rating is 2-3/10. He presents with limited Rt SB and bil Rot with end range stretch.  He has signif trigger points present in bil upper traps, notably anterior and posterior aspects of Lt upper trap > Rt upper trap.  He has limited spinal mobility of Rt ribcage and upper thoracic PAs.  PT performed DN today to address TPs since this had signif helped Pt in the past.  Improved ROM into Rt SB end of session.  Heat used for soreness.  Pt will benefit from further PT 1x/week x 6 more visits to address soft tissue and joint mobility restrictions to improve pain and ROM.    Personal Factors and Comorbidities Comorbidity 1    Comorbidities Lt sided hemiparesis/weakness    Examination-Activity Limitations Sit    Examination-Participation Restrictions Driving    Stability/Clinical Decision Making Stable/Uncomplicated    Rehab Potential  Excellent    PT Frequency 1x / week    PT Duration 6 weeks    PT Treatment/Interventions ADLs/Self Care Home Management;Cryotherapy;Electrical Stimulation;Traction;Moist Heat;Therapeutic activities;Therapeutic exercise;Neuromuscular re-education;Patient/family education;Manual techniques;Taping;Dry needling;Joint Manipulations;Spinal Manipulations    PT Next Visit Plan assess response to DN #5, continue manual and postural strength, stretching, try thoracic foam roller    PT Home Exercise Plan Access Code: [email or web address]    Consulted and Agree with Plan of Care Patient             Patient will benefit from skilled therapeutic intervention in order to improve the following deficits and impairments:  Impaired flexibility, Pain, Postural dysfunction, Hypomobility  Visit Diagnosis: Cervicalgia - Plan: PT plan of care cert/re-cert  Cramp and spasm - Plan: PT plan of care cert/re-cert  Abnormal posture - Plan: PT plan of care cert/re-cert     Problem List Patient Active Problem List   Diagnosis Date Noted   Hyperlipidemia 06/24/2018   Left hemiparesis ([place]) 06/22/2018   ADHD 06/22/2018   Paronychia of toe of left foot 06/11/2015   Cough 06/11/2015   Irregular heart beats 12/19/2014   Difficulty concentrating 12/19/2014   Preventative health care 12/18/2014   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, [place], [place], [address] Phone: [phone]   Fax:  [phone]  Name: Michael Stark MRN: [id number] Date of Birth: 1981-03-19

## 2020-10-31 ENCOUNTER — Other Ambulatory Visit: Payer: Self-pay

## 2020-11-01 ENCOUNTER — Ambulatory Visit: Payer: Managed Care, Other (non HMO) | Admitting: Internal Medicine

## 2020-11-01 ENCOUNTER — Encounter: Payer: Self-pay | Admitting: Internal Medicine

## 2020-11-01 ENCOUNTER — Other Ambulatory Visit (HOSPITAL_COMMUNITY)
Admission: RE | Admit: 2020-11-01 | Discharge: 2020-11-01 | Disposition: A | Discharge status: Home / Self Care | Payer: Managed Care, Other (non HMO) | Source: Ambulatory Visit | Attending: Internal Medicine | Admitting: Internal Medicine

## 2020-11-01 VITALS — BP 110/74 | HR 56 | Temp 97.5°F | Wt 268.0 lb

## 2020-11-01 DIAGNOSIS — D229 Melanocytic nevi, unspecified: Secondary | ICD-10-CM | POA: Diagnosis not present

## 2020-11-01 DIAGNOSIS — M25442 Effusion, left hand: Secondary | ICD-10-CM

## 2020-11-01 DIAGNOSIS — Z1283 Encounter for screening for malignant neoplasm of skin: Secondary | ICD-10-CM

## 2020-11-01 DIAGNOSIS — Z23 Encounter for immunization: Secondary | ICD-10-CM | POA: Diagnosis not present

## 2020-11-01 DIAGNOSIS — Z113 Encounter for screening for infections with a predominantly sexual mode of transmission: Secondary | ICD-10-CM

## 2020-11-01 NOTE — Progress Notes (Signed)
Established Patient Office Visit     This visit occurred during the SARS-CoV-2 public health emergency.  Safety protocols were in place, including screening questions prior to the visit, additional usage of staff PPE, and extensive cleaning of exam room while observing appropriate contact time as indicated for disinfecting solutions.    CC/Reason for Visit: Discuss some acute concerns  HPI: Michael Stark is a 40 y.o. male who is coming in today for the above mentioned reasons.  The issues he would like to discuss are as follows:  1.  He would like a referral to dermatology due to multiple moles on his trunk.  Some of them bleed with touch.  2.  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  3.  While in [place] about 8 weeks ago he fell onto outstretched hands.  Since then he has had left fifth finger swelling, he was told it was dislocated and had it buddy taped for 2 weeks, however the swelling persists as well as decreased grip strength.  This is his nondominant hand.  4.  He is not sure he has had his HPV and hepatitis vaccines, if not he would like them updated.  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 He has never used smokeless tobacco. He reports current alcohol use. He reports that he does not use drugs.  Allergies: Allergies  Allergen Reactions   Other     Cats, and seasonal   Cefaclor Rash    Family History:  Family History  Problem Relation Age of Onset   Anxiety disorder Mother    Anxiety  disorder Brother      Current Outpatient Medications:    cetirizine (ZYRTEC) 10 MG tablet, Take 10 mg by mouth once., Disp: , Rfl:    cyclobenzaprine (FLEXERIL) 10 MG tablet, Take 1 tablet (10 mg total) by mouth 3 (three) times daily as needed for muscle spasms., Disp: 30 tablet, Rfl: 0   lamoTRIgine (LAMICTAL) 100 MG tablet, TAKE 1 TABLET (100 MG TOTAL) BY MOUTH 2 (TWO) TIMES DAILY., Disp: 20 tablet, Rfl: 0   naproxen (NAPROSYN) 500 MG tablet, TAKE 1 TABLET BY MOUTH TWICE A DAY WITH MEALS AS NEEDED FOR PAIN, Disp: 30 tablet, Rfl: 0   VYVANSE 20 MG capsule, neurology, Disp: , Rfl:   Review of Systems:  Constitutional: Denies fever, chills, diaphoresis, appetite change and fatigue.  HEENT: Denies photophobia, eye pain, redness, hearing loss, ear pain, congestion, sore throat, rhinorrhea, sneezing, mouth sores, trouble swallowing, neck pain, neck stiffness and tinnitus.   Respiratory: Denies SOB, DOE, cough, chest tightness,  and wheezing.   Cardiovascular: Denies chest pain, palpitations and leg swelling.  Gastrointestinal: Denies nausea, vomiting, abdominal pain, diarrhea, constipation, blood in stool and abdominal distention.  Genitourinary: Denies dysuria, urgency, frequency, hematuria, flank pain and difficulty urinating.  Endocrine: Denies: hot or cold intolerance, sweats, changes in hair or nails, polyuria, polydipsia. Musculoskeletal: Denies myalgias, back pain and gait problem.  Skin: Denies pallor, rash and wound.  Neurological: Denies dizziness, seizures, syncope, weakness, light-headedness, numbness and headaches.  Hematological: Denies adenopathy. Easy bruising, personal or family bleeding history  Psychiatric/Behavioral: Denies suicidal ideation, mood changes, confusion, nervousness, sleep disturbance and agitation    Physical Exam: Vitals:   11/01/20 0703  BP: 110/74  Pulse: (!) 56  Temp: (!) 97.5 F (36.4 C)  TempSrc: Oral  SpO2: 97%  Weight: 268 lb (121.6 kg)     Body mass index is 34.41 kg/m.   Constitutional: NAD, calm, comfortable Eyes: PERRL, lids and conjunctivae normal, wears corrective lenses ENMT: Mucous membranes are moist.  Respiratory: clear to auscultation bilaterally, no wheezing, no crackles. Normal respiratory effort. No accessory muscle use.  Cardiovascular: Regular rate and rhythm, no murmurs / rubs / gallops. No extremity edema.   Musculoskeletal: no clubbing / cyanosis.  Left fifth finger appears dislocated at the PIP joint and edematous.  Good ROM, no contractures. Normal muscle tone.  Skin: Multiple moles and freckles over his trunk, none of them appear to have a grossly malignant appearance. Psychiatric: Normal judgment and insight. Alert and oriented x 3. Normal mood.    Impression and Plan:  Screening exam for skin cancer Numerous skin moles  - Plan: Ambulatory referral to Dermatology  Finger joint swelling, left  - Plan: DG Hand Complete Left -Given length of injury and persistent D formation, consider referral to hand specialist pending x-ray results.  Screen for STD (sexually transmitted disease)  - Plan: Hepatitis Acute Panel, HIV antibody (with reflex), RPR, Urine cytology ancillary only -We have discussed safe sexual practices.  Need for HPV vaccination -First HPV vaccine administered today.  Need for hepatitis A and B vaccination -As we have not been able to find proof of vaccination, we have discussed proceeding with Twinrix vaccination today.  He will receive his first dose in office.  Time spent: 33 minutes reviewing chart, discussing acute issues and formulating plan of care.   Patient Instructions  -Nice seeing you today!!  -Lab work today; will notify you once results are available.  -Left hand xray requested.  -Referral to dermatology placed.  -HPV and Hepatitis A and B vaccines today.  -Schedule follow up in 3 months for your physical. Please come in fasting that day.    Lelon Frohlich, MD [facility]

## 2020-11-01 NOTE — Patient Instructions (Signed)
-  Nice seeing you today!!  -Lab work today; will notify you once results are available.  -Left hand xray requested.  -Referral to dermatology placed.  -HPV and Hepatitis A and B vaccines today.  -Schedule follow up in 3 months for your physical. Please come in fasting that day.

## 2020-11-01 NOTE — Addendum Note (Signed)
Addended by: Westley Hummer B on: 11/01/2020 08:08 AM   Modules accepted: Orders

## 2020-11-03 LAB — URINE CYTOLOGY ANCILLARY ONLY
Chlamydia: NEGATIVE
Comment: NEGATIVE
Comment: NEGATIVE
Comment: NORMAL
Neisseria Gonorrhea: NEGATIVE
Trichomonas: NEGATIVE

## 2020-11-04 LAB — HEPATITIS PANEL, ACUTE
Hep A IgM: NONREACTIVE
Hep B C IgM: NONREACTIVE
Hepatitis B Surface Ag: NONREACTIVE
Hepatitis C Ab: NONREACTIVE
SIGNAL TO CUT-OFF: 0.01 (ref <1.00)

## 2020-11-04 LAB — HIV ANTIBODY (ROUTINE TESTING W REFLEX): HIV 1&2 Ab, 4th Generation: NONREACTIVE

## 2020-11-04 LAB — RPR: RPR Ser Ql: NONREACTIVE

## 2020-11-05 ENCOUNTER — Encounter: Payer: Self-pay | Admitting: Physical Therapy

## 2020-11-05 ENCOUNTER — Ambulatory Visit: Payer: Managed Care, Other (non HMO) | Admitting: Physical Therapy

## 2020-11-05 ENCOUNTER — Other Ambulatory Visit: Payer: Self-pay

## 2020-11-05 DIAGNOSIS — R293 Abnormal posture: Secondary | ICD-10-CM

## 2020-11-05 DIAGNOSIS — R252 Cramp and spasm: Secondary | ICD-10-CM

## 2020-11-05 DIAGNOSIS — M542 Cervicalgia: Secondary | ICD-10-CM | POA: Diagnosis not present

## 2020-11-05 NOTE — Therapy (Signed)
[facility] [address], [place], [place], [address] Phone: [phone]   Fax:  [phone]  Physical Therapy Treatment  Patient Details  Name: Michael Stark MRN: [id number] Date of Birth: December 06, 1980 Referring Provider (PT): Lelon Frohlich, MD   Encounter Date: 11/05/2020   PT End of Session - 11/05/20 1318     Visit Number 7    Date for PT Re-Evaluation 12/10/20    Authorization Type [employer]    PT Start Time 1230    PT Stop Time 1325    PT Time Calculation (min) 55 min    Activity Tolerance Patient tolerated treatment well    Behavior During Therapy [facility] for tasks assessed/performed             Past Medical History:  Diagnosis Date   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   Subjective Assessment - 11/05/20 1231     Subjective the pain and tingling is gone since last visit.  I still have tension in Lt side of neck.    Pertinent History Lt hemiparesis/muscle atrophy- age 49 due to underlying condition in the Rt motor cortex.  Now resultant Lt UE/LE weakness and assymmetries- mild.    Diagnostic tests none recent    Patient Stated Goals reduce neck pain, improve flexibility    Currently in Pain? No/denies                               [facility] Adult PT Treatment/Exercise - 11/05/20 0001       Neck Exercises: Seated   Cervical Rotation Right;Left    Cervical Rotation Limitations trial of SNAGS for C/T junction, Pt unsure if felt helpful do d/c'd    Lateral Flexion Right    Lateral Flexion Limitations hold 20 sec with overpressure      Neck Exercises: Supine   Other Supine Exercise tspine ext over  foam roller x 10, then lay vertically on foam with W arms for chest stretch, dowel overhead flexion bil UEs x 3 rounds      Moist Heat Therapy   Number Minutes Moist Heat 10 Minutes    Moist Heat Location Cervical      Manual Therapy   Manual Therapy Soft tissue mobilization;Joint mobilization    Joint Mobilization thoracic PAs Gr III/IV T1-T4, rib springing bil Gr II/III    Soft tissue mobilization skilled palpation and assessment of tissue before, during, and after DN              Trigger Point Dry Needling - 11/05/20 0001     Consent Given? Yes    Education Handout Provided Previously provided    Muscles Treated Head and Neck Upper trapezius;Cervical multifidi;Levator scapulae    Dry Needling Comments Lt upper trap anterior and posterior - signif twitch and release both ant/post    Upper Trapezius Response Twitch reponse elicited;Palpable increased muscle length    Levator Scapulae Response Twitch response elicited;Palpable increased muscle length    Cervical multifidi Response Twitch reponse elicited;Palpable increased muscle length  PT Short Term Goals - 10/29/20 1708       PT SHORT TERM GOAL #1   Title be independent in initial HEP    Status Achieved      PT SHORT TERM GOAL #2   Title report a 30% reduction in neck stiffness with driving and ADLs    Baseline regressed traveling    Status Partially Met               PT Long Term Goals - 10/29/20 1708       PT LONG TERM GOAL #1   Title be indpendent in advanced HEP    Status On-going      PT LONG TERM GOAL #2   Title verbalize and demonstrate body mechanics modifications for cervical protection with daily tasks    Status On-going      PT LONG TERM GOAL #3   Title report a 60% reduction in neck pain with turning head with driving    Baseline 92% improved before traveling, regressed to 20% improvement    Status On-going                   Plan - 11/05/20 1318      Clinical Impression Statement Pt arrived with report of no pain and resolved numbness/tingling since last session.  He had symmetrical cervical rotation but ongoing restriction into Rt SB with contralateral soft tissue stretch end feel from upper trap anterior band>posterior band.  He continues to have TPs in Lt upper trap although more localized today than last session.  He also had signif release of C5-T1 cervical multifidi today with improved segmental mobility following DN.  Improved Rt SB ROM end of session.  Heat used for soreness.  PT reviewed ways to mobilize, release and stretch using foam roller, dense pressure release ball against wall for Lt intrascapular tension, and SNAGs.  Pt to return in 1-2 weeks to determine carry over from today's session.    Comorbidities Lt sided hemiparesis/weakness    PT Frequency 1x / week    PT Duration 6 weeks    PT Next Visit Plan assess response to DN #6, continue manual and postural strength, doorway stretch or on foam roller, scapular stab, Rt SB stretch for Lt upper trap    PT Home Exercise Plan Access Code: [email or web address]    Consulted and Agree with Plan of Care Patient             Patient will benefit from skilled therapeutic intervention in order to improve the following deficits and impairments:     Visit Diagnosis: Cervicalgia  Cramp and spasm  Abnormal posture     Problem List Patient Active Problem List   Diagnosis Date Noted   Hyperlipidemia 06/24/2018   Left hemiparesis ([place]) 06/22/2018   ADHD 06/22/2018   Paronychia of toe of left foot 06/11/2015   Cough 06/11/2015   Irregular heart beats 12/19/2014   Difficulty concentrating 12/19/2014   Preventative health care 12/18/2014   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, [place], [place], [address] Phone: [phone]   Fax:   [phone]  Name: Michael Stark MRN: [id number] Date of Birth: [birth year]

## 2020-11-13 ENCOUNTER — Ambulatory Visit: Payer: Managed Care, Other (non HMO)

## 2020-11-18 ENCOUNTER — Ambulatory Visit: Payer: Managed Care, Other (non HMO)

## 2020-11-18 DIAGNOSIS — M542 Cervicalgia: Secondary | ICD-10-CM

## 2020-11-18 DIAGNOSIS — R252 Cramp and spasm: Secondary | ICD-10-CM

## 2020-11-18 DIAGNOSIS — R293 Abnormal posture: Secondary | ICD-10-CM

## 2020-11-18 NOTE — Therapy (Signed)
[facility] 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   Subjective Assessment - 11/18/20 1617     Subjective I am feeling better after the last 2 weeks.  90-95% overall improvement since returning to PT.    Pertinent History Lt hemiparesis/muscle atrophy- age 57 due to underlying condition in the Rt motor cortex.  Now resultant Lt UE/LE weakness and assymmetries- mild.    Currently in Pain? Yes    Pain Score 2     Pain Location Neck    Pain Orientation Left    Pain Descriptors / Indicators Tingling;Tightness    Pain Type Chronic pain    Pain Onset More than a month ago    Pain Frequency Intermittent    Aggravating Factors  driving, not sitting upright    Pain Relieving Factors dry needling, change of position                               [facility] Adult PT Treatment/Exercise - 11/18/20 0001       Moist Heat Therapy   Number Minutes Moist Heat 10 Minutes    Moist Heat Location Cervical      Manual Therapy   Manual Therapy Soft tissue mobilization;Joint mobilization    Manual therapy  comments elongation and release to bil neck and upper traps    Joint Mobilization thoracic PAs Gr III/IV T1-T4, rib springing bil Gr II/III    Soft tissue mobilization skilled palpation and assessment of tissue before, during, and after DN              Trigger Point Dry Needling - 11/18/20 0001     Consent Given? Yes    Education Handout Provided Previously provided    Muscles Treated Head and Neck Upper trapezius;Cervical multifidi;Levator scapulae;Suboccipitals    Dry Needling Comments Lt upper trap anterior and posterior - signif twitch and release both ant/post    Upper Trapezius Response Twitch reponse elicited;Palpable increased muscle length    Suboccipitals Response Palpable increased muscle length    Levator Scapulae Response Twitch response elicited;Palpable increased muscle length    Cervical multifidi Response Twitch reponse elicited;Palpable increased muscle length                    PT Short Term Goals - 11/18/20 1620       PT SHORT TERM GOAL #2   Title report a 30% reduction  in neck stiffness with driving and ADLs    Status Achieved               PT Long Term Goals - 11/18/20 1620       PT LONG TERM GOAL #3   Title report a 60% reduction in neck pain with turning head with driving    Baseline [id number] since returning to PT                   Plan - 11/18/20 1655     Clinical Impression Statement Pt reports 90-95% overall improvement in cervical symptoms since returning to PT from his trip.  Pt is independent and compliant in HEP for flexibility and strength and reports significant pain reduction and improved cervical mobility. Pt with tension in Lt>Rt cervical spine and had good twitch response with dry needling and manual therapy.  Pt continues to be challenged and consistent with current HEP.  Pt will continue to benefit from skilled PT to address flexibility, tissue mobility and thoracic segmental mobility.    PT Frequency 1x / week     PT Duration 6 weeks    PT Treatment/Interventions ADLs/Self Care Home Management;Cryotherapy;Electrical Stimulation;Traction;Moist Heat;Therapeutic activities;Therapeutic exercise;Neuromuscular re-education;Patient/family education;Manual techniques;Taping;Dry needling;Joint Manipulations;Spinal Manipulations    PT Next Visit Plan assess response to DN, continue manual and postural strength, doorway stretch or on foam roller, scapular stab, Rt SB stretch for Lt upper trap    PT Home Exercise Plan Access Code: [email or web address]    Consulted and Agree with Plan of Care Patient             Patient will benefit from skilled therapeutic intervention in order to improve the following deficits and impairments:  Impaired flexibility, Pain, Postural dysfunction, Hypomobility  Visit Diagnosis: Cervicalgia  Cramp and spasm  Abnormal posture     Problem List Patient Active Problem List   Diagnosis Date Noted   Hyperlipidemia 06/24/2018   Left hemiparesis ([place]) 06/22/2018   ADHD 06/22/2018   Paronychia of toe of left foot 06/11/2015   Cough 06/11/2015   Irregular heart beats 12/19/2014   Difficulty concentrating 12/19/2014   Preventative health care 12/18/2014   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 [place], [place], [address] Phone: [phone]   Fax:  [phone]  Name: Michael Stark MRN: [id number] Date of Birth: Feb 28, 1981

## 2020-11-27 ENCOUNTER — Ambulatory Visit: Payer: Managed Care, Other (non HMO) | Attending: Internal Medicine

## 2020-11-27 ENCOUNTER — Other Ambulatory Visit: Payer: Self-pay

## 2020-11-27 DIAGNOSIS — R252 Cramp and spasm: Secondary | ICD-10-CM | POA: Diagnosis present

## 2020-11-27 DIAGNOSIS — M542 Cervicalgia: Secondary | ICD-10-CM

## 2020-11-27 DIAGNOSIS — R293 Abnormal posture: Secondary | ICD-10-CM | POA: Diagnosis present

## 2020-11-27 NOTE — Therapy (Signed)
[facility] [address], [place], [place], [address] Phone: [phone]   Fax:  [phone]  Physical Therapy Treatment  Patient Details  Name: Michael Stark MRN: [id number] Date of Birth: 1980-09-23 Referring Provider (PT): Lelon Frohlich, MD   Encounter Date: 11/27/2020   PT End of Session - 11/27/20 1655     Visit Number 9    Date for PT Re-Evaluation 12/10/20    Authorization Type [employer]    PT Start Time 1622    PT Stop Time 1703    PT Time Calculation (min) 41 min    Activity Tolerance Patient tolerated treatment well    Behavior During Therapy [facility] for tasks assessed/performed             Past Medical History:  Diagnosis Date   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   Subjective Assessment - 11/27/20 1625     Subjective I am getting stiff again at the end of a week after I am here.  90-95% overall improvement.    Pertinent History Lt hemiparesis/muscle atrophy- age 42 due to underlying condition in the Rt motor cortex.  Now resultant Lt UE/LE weakness and assymmetries- mild.    Currently in Pain? Yes    Pain Score 0-No pain   stiffness   Pain Location Neck    Pain Orientation Left    Pain Descriptors / Indicators Tingling;Tightness    Pain Type Chronic pain    Pain Onset More than a month ago    Pain Frequency Intermittent    Aggravating Factors  not sitting upright, computer work, as the week progresses after DN    Pain Relieving Factors dry needling, change of position                               [facility] Adult PT Treatment/Exercise - 11/27/20 0001       Moist Heat Therapy   Number  Minutes Moist Heat 10 Minutes    Moist Heat Location Cervical      Manual Therapy   Manual Therapy Soft tissue mobilization;Joint mobilization    Manual therapy comments elongation and release to bil neck and upper traps    Joint Mobilization thoracic PAs Gr III/IV T1-T4, rib springing bil Gr II/III    Soft tissue mobilization skilled palpation and assessment of tissue before, during, and after DN              Trigger Point Dry Needling - 11/27/20 0001     Consent Given? Yes    Education Handout Provided Previously provided    Muscles Treated Head and Neck Upper trapezius;Cervical multifidi;Levator scapulae;Suboccipitals    Dry Needling Comments Lt upper trap anterior and posterior - signif twitch and release both ant/post    Upper Trapezius Response Twitch reponse elicited;Palpable increased muscle length    Suboccipitals Response Palpable increased muscle length    Levator Scapulae Response Twitch response elicited;Palpable increased muscle length    Cervical multifidi Response Twitch reponse elicited;Palpable increased muscle length  PT Short Term Goals - 11/18/20 1620       PT SHORT TERM GOAL #2   Title report a 30% reduction in neck stiffness with driving and ADLs    Status Achieved               PT Long Term Goals - 11/18/20 1620       PT LONG TERM GOAL #3   Title report a 60% reduction in neck pain with turning head with driving    Baseline [id number] since returning to PT                   Plan - 11/27/20 1657     Clinical Impression Statement Pt reports 90-95% overall improvement in cervical symptoms since returning to PT from his trip.  Pt is independent and compliant in HEP for flexibility and strength and reports significant pain reduction and improved cervical mobility. Pt with tension in Lt>Rt cervical spine and had good twitch response with dry needling and manual therapy.  Pt continues to be challenged and consistent  with current HEP.  Pt will continue to benefit from skilled PT to address flexibility, tissue mobility and thoracic segmental mobility.    Rehab Potential Excellent    PT Frequency 1x / week    PT Duration 6 weeks    PT Treatment/Interventions ADLs/Self Care Home Management;Cryotherapy;Electrical Stimulation;Traction;Moist Heat;Therapeutic activities;Therapeutic exercise;Neuromuscular re-education;Patient/family education;Manual techniques;Taping;Dry needling;Joint Manipulations;Spinal Manipulations    PT Next Visit Plan continue DN, add Lt rhomboids and subscapularis. continue flexibility and postural strength    PT Home Exercise Plan Access Code: [email or web address]    Consulted and Agree with Plan of Care Patient             Patient will benefit from skilled therapeutic intervention in order to improve the following deficits and impairments:  Impaired flexibility, Pain, Postural dysfunction, Hypomobility  Visit Diagnosis: Cervicalgia  Cramp and spasm  Abnormal posture     Problem List Patient Active Problem List   Diagnosis Date Noted   Hyperlipidemia 06/24/2018   Left hemiparesis ([place]) 06/22/2018   ADHD 06/22/2018   Paronychia of toe of left foot 06/11/2015   Cough 06/11/2015   Irregular heart beats 12/19/2014   Difficulty concentrating 12/19/2014   Preventative health care 12/18/2014   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, [place], [place], [address] Phone: [phone]   Fax:  [phone]  Name: Michael Stark MRN: [id number] Date of Birth: [birth year]

## 2020-12-03 ENCOUNTER — Encounter: Payer: Managed Care, Other (non HMO) | Admitting: Physical Therapy

## 2020-12-10 ENCOUNTER — Encounter: Payer: Self-pay | Admitting: Physical Therapy

## 2020-12-10 ENCOUNTER — Ambulatory Visit: Payer: Managed Care, Other (non HMO) | Admitting: Physical Therapy

## 2020-12-10 ENCOUNTER — Other Ambulatory Visit: Payer: Self-pay

## 2020-12-10 DIAGNOSIS — M542 Cervicalgia: Secondary | ICD-10-CM

## 2020-12-10 DIAGNOSIS — R293 Abnormal posture: Secondary | ICD-10-CM

## 2020-12-10 DIAGNOSIS — R252 Cramp and spasm: Secondary | ICD-10-CM

## 2020-12-10 NOTE — Therapy (Signed)
[facility] [address], [place], [place], [address] Phone: [phone]   Fax:  [phone]  Physical Therapy Treatment  Patient Details  Name: Michael Stark MRN: [id number] Date of Birth: 11-08-1980 Referring Provider (PT): Lelon Frohlich, MD   Encounter Date: 12/10/2020   PT End of Session - 12/10/20 1716     Visit Number 10    Date for PT Re-Evaluation 03/11/21    Authorization Type [employer]    PT Start Time 1626    PT Stop Time 1705    PT Time Calculation (min) 39 min    Activity Tolerance Patient tolerated treatment well    Behavior During Therapy [facility] for tasks assessed/performed             Past Medical History:  Diagnosis Date   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   Subjective Assessment - 12/10/20 1626     Subjective Most of my symptoms are at night.  I sleep on my Lt side or on my back.  I am 90-95% better.    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  Now resultant Lt UE/LE weakness and assymmetries- mild.    Patient Stated Goals reduce neck pain, improve flexibility    Currently in Pain? No/denies    Pain Descriptors / Indicators Tingling    Pain Frequency Several days a week    Aggravating Factors  tingling if sleeps on Rt side, desktop set up, looking Lt for prolonged period of time    Pain Relieving Factors dry needling                [facility] PT Assessment - 12/10/20 0001       Assessment   Medical Diagnosis neck pain    Referring Provider (PT) Lelon Frohlich, MD    Onset Date/Surgical Date 08/04/18    Hand Dominance  Right    Prior Therapy none      Posture/Postural Control   Posture Comments Lt LE atrophy, Lt intrascapular atrophy      AROM   AROM Assessment Site Cervical    Cervical Flexion 55    Cervical Extension 55    Cervical - Right Side Bend 43    Cervical - Left Side Bend 45    Cervical - Right Rotation 65    Cervical - Left Rotation 65      Palpation   Spinal mobility reduced costotransverse glide Lt T4    Palpation comment trigger points present along Lt upper trap                           [facility] Adult PT Treatment/Exercise - 12/10/20 0001       Manual Therapy   Manual Therapy Soft tissue mobilization    Manual therapy comments Lt upper trap elongation after DN    Joint Mobilization Lt costotransverse mob Gr II/III    Soft tissue mobilization skilled palpation and assessment of tissue before, during, and after DN              Trigger Point  Dry Needling - 12/10/20 0001     Consent Given? Yes    Education Handout Provided Previously provided    Muscles Treated Head and Neck Upper trapezius    Dry Needling Comments Lt, anterior and posterior approach    Upper Trapezius Response Twitch reponse elicited;Palpable increased muscle length                    PT Short Term Goals - 12/10/20 1719       PT SHORT TERM GOAL #1   Title be independent in initial HEP    Status Achieved      PT SHORT TERM GOAL #2   Title report a 30% reduction in neck stiffness with driving and ADLs    Status Achieved               PT Long Term Goals - 12/10/20 1719       PT LONG TERM GOAL #1   Title be indpendent in advanced HEP    Status Achieved      PT LONG TERM GOAL #2   Title verbalize and demonstrate body mechanics modifications for cervical protection with daily tasks    Status Achieved      PT LONG TERM GOAL #3   Title report a 60% reduction in neck pain with turning head with driving    Status Achieved      PT LONG TERM GOAL #4   Title Pt  will report reduced incidence of tingling with desktop work and at night with sleeping to no more than twice weekly.    Time 12    Period Weeks    Status New    Target Date 03/11/21                   Plan - 12/10/20 1717     Clinical Impression Statement Pt reports mostly resolved pain in Lt cervical region.  He continues to experience tingling and tightness along Lt upper trap with prolonged looking to Lt at computer (2 monitors) and with sleeping on Rt side.  Cervical ROM is nearly full and symmetrical but with some discomfort at end range into Lt Rot, Rt SB.  He has ongoing Lt upper trap trigger points which are reduced in size and intensity since starting PT.  He has mild thoracic spine joint restrictions.  Lt sided tightness and reactivity may be related to Lt sided hemiparesis.  New goal set to reduce frequency of tightness and tingling incidence to </= 2x/week.  PT and Pt agreed to taper to monthly appointments for ongoing manual therapy treatment to address ongoing Lt cervical painful tightness and tingling since DN has been so helpful to date.    Personal Factors and Comorbidities Comorbidity 1    Comorbidities Lt sided hemiparesis/weakness    Examination-Activity Limitations Sit;Sleep    Examination-Participation Restrictions Driving;Occupation    Stability/Clinical Decision Making Stable/Uncomplicated    Clinical Decision Making Low    Rehab Potential Excellent    PT Frequency Monthy    PT Duration 12 weeks    PT Treatment/Interventions Manual techniques;Dry needling;Therapeutic exercise;Spinal Manipulations;Joint Manipulations    PT Next Visit Plan continue DN, mobs, manual therapy, tapered to 1x/month, f/u on Pt reposition his monitor/laptop set up so not turning head to Lt    PT Home Exercise Plan Access Code: [email or web address]    Consulted and Agree with Plan of Care Patient             Patient  will benefit from skilled therapeutic intervention in order to improve the  following deficits and impairments:  Impaired flexibility, Pain, Postural dysfunction, Hypomobility, Increased muscle spasms  Visit Diagnosis: Cervicalgia - Plan: PT plan of care cert/re-cert  Cramp and spasm - Plan: PT plan of care cert/re-cert  Abnormal posture - Plan: PT plan of care cert/re-cert     Problem List Patient Active Problem List   Diagnosis Date Noted   Hyperlipidemia 06/24/2018   Left hemiparesis ([place]) 06/22/2018   ADHD 06/22/2018   Paronychia of toe of left foot 06/11/2015   Cough 06/11/2015   Irregular heart beats 12/19/2014   Difficulty concentrating 12/19/2014   Preventative health care 12/18/2014   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 [place], [place], [address] Phone: [phone]   Fax:  [phone]  Name: Roye Rusco MRN: [id number] Date of Birth: [birth year]

## 2020-12-17 ENCOUNTER — Encounter: Payer: Managed Care, Other (non HMO) | Admitting: Physical Therapy

## 2021-01-07 ENCOUNTER — Ambulatory Visit: Payer: Managed Care, Other (non HMO) | Attending: Internal Medicine | Admitting: Physical Therapy

## 2021-01-07 ENCOUNTER — Encounter: Payer: Self-pay | Admitting: Physical Therapy

## 2021-01-07 ENCOUNTER — Other Ambulatory Visit: Payer: Self-pay

## 2021-01-07 DIAGNOSIS — M542 Cervicalgia: Secondary | ICD-10-CM | POA: Diagnosis present

## 2021-01-07 DIAGNOSIS — R293 Abnormal posture: Secondary | ICD-10-CM | POA: Diagnosis present

## 2021-01-07 DIAGNOSIS — R252 Cramp and spasm: Secondary | ICD-10-CM | POA: Diagnosis present

## 2021-01-07 NOTE — Therapy (Signed)
[facility] [address], [place], [place], [address] Phone: [phone]   Fax:  [phone]  Physical Therapy Treatment  Patient Details  Name: Michael Stark MRN: [id number] Date of Birth: 03/20/1981 Referring Provider (PT): Lelon Frohlich, MD   Encounter Date: 01/07/2021   PT End of Session - 01/07/21 1707     Visit Number 11    Date for PT Re-Evaluation 03/11/21    Authorization Type [employer]    PT Start Time 1630   Pt late   PT Stop Time 1715    PT Time Calculation (min) 45 min    Activity Tolerance Patient tolerated treatment well    Behavior During Therapy [facility] for tasks assessed/performed             Past Medical History:  Diagnosis Date   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   Subjective Assessment - 01/07/21 1706     Subjective I am doing much better.  I am being mindful of turning my chair vs my head to look at second computer monitor for desk work.  I am stretching in AM which helps too.  I haven't had much pain but continue to notice tightness along Lt side of neck.    Pertinent History Lt hemiparesis/muscle atrophy- age 7 due to underlying condition in the Rt motor cortex.  Now resultant Lt UE/LE weakness and assymmetries- mild.    Diagnostic tests none recent    Patient Stated Goals reduce neck pain, improve flexibility    Currently in Pain? No/denies    Pain Location Neck    Pain Orientation Left    Pain Descriptors / Indicators Tightness    Pain Type Chronic pain                [facility] PT Assessment - 01/07/21 0001       Posture/Postural Control   Posture Comments Lt LE atrophy, Lt  intrascapular atrophy      AROM   Cervical Flexion 55    Cervical Extension 55    Cervical - Right Side Bend 43    Cervical - Left Side Bend 45    Cervical - Right Rotation 65    Cervical - Left Rotation 65      Palpation   Spinal mobility improved thoracic mobility compared to previous visits, WNL    Palpation comment mild presence of TP in Lt upper trap, small in size, tightness and tenderness in Lt scalenes                           [facility] Adult PT Treatment/Exercise - 01/07/21 0001       Moist Heat Therapy   Number Minutes Moist Heat 8 Minutes    Moist Heat Location Cervical   Lt     Manual Therapy   Manual Therapy Passive ROM;Soft tissue mobilization;Joint mobilization    Manual therapy comments active release Lt scalenes, supine, PT tacking distal scalenes with Pt A/ROM into Rt SB x 10 reps    Joint Mobilization Lt U-joint sideglides Gr II/III C3-C6    Soft tissue mobilization TP release  Lt levator, elongation Lt scalenes, upper trap    Passive ROM cervical Rot, bil, x 10 sec end range stretch, x 2              Trigger Point Dry Needling - 01/07/21 0001     Consent Given? Yes    Education Handout Provided Previously provided    Muscles Treated Head and Neck Upper trapezius    Dry Needling Comments Lt, ant and post approach    Upper Trapezius Response Twitch reponse elicited;Palpable increased muscle length   only mild twitch response today, expected due to signif smaller TP                    PT Short Term Goals - 12/10/20 1719       PT SHORT TERM GOAL #1   Title be independent in initial HEP    Status Achieved      PT SHORT TERM GOAL #2   Title report a 30% reduction in neck stiffness with driving and ADLs    Status Achieved               PT Long Term Goals - 12/10/20 1719       PT LONG TERM GOAL #1   Title be indpendent in advanced HEP    Status Achieved      PT LONG TERM GOAL #2   Title verbalize and  demonstrate body mechanics modifications for cervical protection with daily tasks    Status Achieved      PT LONG TERM GOAL #3   Title report a 60% reduction in neck pain with turning head with driving    Status Achieved      PT LONG TERM GOAL #4   Title Pt will report reduced incidence of tingling with desktop work and at night with sleeping to no more than twice weekly.    Time 12    Period Weeks    Status New    Target Date 03/11/21                   Plan - 01/07/21 1714     Clinical Impression Statement Pt reports reduced pain and mobility since last session.  He is stretching consistently in AM and using improved body mechanics and posture when toggling attention between 2 computer monitors for desk work (turns chair vs head to keep in neutral cervical posture).  Cervical ROM remains nearly symmetrical with end range restriction into Rt SB secondary to tight scalenes and upper trap.  DN yielded only very mild twitch response secondary to smaller target.  Active release added to manual therapy today for scalenes and PT instructed Pt in how to perform this on self.  Pt will return in 1 month secondary to tapered plan for visits due to Pt managing symptoms pretty well.    Comorbidities Lt sided hemiparesis/weakness    PT Frequency Monthy    PT Duration 12 weeks    PT Treatment/Interventions Manual techniques;Dry needling;Therapeutic exercise;Spinal Manipulations;Joint Manipulations    PT Next Visit Plan check for need for DN Lt upper trap, check Rt SB ROM, elongation techniques for Lt scalenes, Lt U-joint sideglides, finalize HEP over next 2 months/appointments    PT Home Exercise Plan Access Code: [email or web address]    Consulted and Agree with Plan of Care Patient             Patient will benefit from skilled therapeutic intervention in order to improve the following deficits  and impairments:     Visit Diagnosis: Cervicalgia  Cramp and spasm  Abnormal  posture     Problem List Patient Active Problem List   Diagnosis Date Noted   Hyperlipidemia 06/24/2018   Left hemiparesis ([place]) 06/22/2018   ADHD 06/22/2018   Paronychia of toe of left foot 06/11/2015   Cough 06/11/2015   Irregular heart beats 12/19/2014   Difficulty concentrating 12/19/2014   Preventative health care 12/18/2014   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 [place], [place], [address] Phone: [phone]   Fax:  [phone]  Name: Michael Stark MRN: [id number] Date of Birth: 1981/01/30

## 2021-02-11 ENCOUNTER — Encounter: Payer: Managed Care, Other (non HMO) | Admitting: Physical Therapy

## 2021-02-12 ENCOUNTER — Ambulatory Visit: Payer: Managed Care, Other (non HMO) | Attending: Internal Medicine

## 2021-02-12 ENCOUNTER — Telehealth: Payer: Self-pay

## 2021-02-12 DIAGNOSIS — R252 Cramp and spasm: Secondary | ICD-10-CM | POA: Insufficient documentation

## 2021-02-12 DIAGNOSIS — M542 Cervicalgia: Secondary | ICD-10-CM | POA: Insufficient documentation

## 2021-02-12 DIAGNOSIS — R293 Abnormal posture: Secondary | ICD-10-CM | POA: Insufficient documentation

## 2021-02-12 NOTE — Telephone Encounter (Signed)
Pt missed appt today at 4:15. Pt reports that he called to cancel due to work conflict.  Pt rescheduled for next week.

## 2021-02-19 ENCOUNTER — Other Ambulatory Visit: Payer: Self-pay

## 2021-02-19 ENCOUNTER — Ambulatory Visit: Payer: Managed Care, Other (non HMO)

## 2021-02-19 DIAGNOSIS — R252 Cramp and spasm: Secondary | ICD-10-CM

## 2021-02-19 DIAGNOSIS — M542 Cervicalgia: Secondary | ICD-10-CM

## 2021-02-19 DIAGNOSIS — R293 Abnormal posture: Secondary | ICD-10-CM

## 2021-02-19 NOTE — Therapy (Signed)
[facility] [place] [facility] [place], [place], [address] Phone: [phone]   Fax:  [phone]  Physical Therapy Treatment  Patient Details  Name: Michael Stark MRN: [id number] Date of Birth: 1980-10-05 Referring Provider (PT): Lelon Frohlich, MD   Encounter Date: 02/19/2021   PT End of Session - 02/19/21 1658     Visit Number 12    Date for PT Re-Evaluation 03/11/21    Authorization Type [employer]    PT Start Time 1618    PT Stop Time 1657    PT Time Calculation (min) 39 min    Activity Tolerance Patient tolerated treatment well    Behavior During Therapy [facility] for tasks assessed/performed             Past Medical History:  Diagnosis Date   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   Subjective Assessment - 02/19/21 1620     Subjective I've had some stress at work.  My neck has gotten way worse in the past few weeks that is likely stress and posture related.  I don't get the catch anymore and I am having more tingling in the Lt upper traps.    Pertinent History Lt hemiparesis/muscle atrophy- age 31 due to underlying condition in the Rt motor cortex.  Now resultant Lt UE/LE weakness and assymmetries- mild.    Patient Stated Goals reduce neck pain, improve flexibility    Currently in Pain? Yes    Pain Score 0-No pain   tingling in the Lt upper traps   Pain Location Neck    Pain Orientation Left    Pain Descriptors / Indicators Radiating;Pins and needles    Pain Type Chronic pain    Pain Onset More than a month ago    Pain Frequency Other (Comment)    Aggravating Factors  stress, computer work, sitting too long    Pain Relieving  Factors supine with Lt arm under the head (briefly), workstation adjustments                               [facility] Adult PT Treatment/Exercise - 02/19/21 0001       Manual Therapy   Manual Therapy Passive ROM;Soft tissue mobilization;Joint mobilization    Manual therapy comments active release Lt scalenes, supine, PT tacking distal scalenes with Pt A/ROM into Rt SB x 10 reps    Joint Mobilization Lt U-joint sideglides Gr II/III C3-C6    Soft tissue mobilization TP release Lt levator, elongation Lt scalenes, upper trap    Passive ROM cervical Rot, bil, x 10 sec end range stretch, x 2              Trigger Point Dry Needling - 02/19/21 0001     Consent Given? Yes    Education Handout Provided Previously provided    Muscles Treated Head and Neck Upper trapezius;Cervical multifidi    Dry Needling Comments Lt, ant and post approach    Upper Trapezius Response Twitch reponse elicited;Palpable increased muscle length   only mild twitch response today, expected due to signif smaller TP  Cervical multifidi Response Twitch reponse elicited;Palpable increased muscle length                     PT Short Term Goals - 12/10/20 1719       PT SHORT TERM GOAL #1   Title be independent in initial HEP    Status Achieved      PT SHORT TERM GOAL #2   Title report a 30% reduction in neck stiffness with driving and ADLs    Status Achieved               PT Long Term Goals - 12/10/20 1719       PT LONG TERM GOAL #1   Title be indpendent in advanced HEP    Status Achieved      PT LONG TERM GOAL #2   Title verbalize and demonstrate body mechanics modifications for cervical protection with daily tasks    Status Achieved      PT LONG TERM GOAL #3   Title report a 60% reduction in neck pain with turning head with driving    Status Achieved      PT LONG TERM GOAL #4   Title Pt will report reduced incidence of tingling with desktop work and at night with  sleeping to no more than twice weekly.    Time 12    Period Weeks    Status New    Target Date 03/11/21                   Plan - 02/19/21 1657     Clinical Impression Statement Pt reports increased pain since last session due to stress at work.  Pt reports Lt UT tension and tingling into the upper trap. He is stretching consistently in AM and using improved body mechanics and posture when toggling attention between 2 computer monitors for desk work (turns chair vs head to keep in neutral cervical posture.  Session focused on manual therapy to address trigger points in Lt upper trap and neck.   DN yielded twitch response due to increased muscle tension over the past few weeks. Pt with improved muscle mobility after manual therapy today.   Pt will return in weeks for ERO.    PT Duration 12 weeks    PT Treatment/Interventions Manual techniques;Dry needling;Therapeutic exercise;Spinal Manipulations;Joint Manipulations    PT Next Visit Plan ERO next session.  continue DN to Lt upper trap as needed.    PT Home Exercise Plan Access Code: [email or web address]    Consulted and Agree with Plan of Care Patient             Patient will benefit from skilled therapeutic intervention in order to improve the following deficits and impairments:  Impaired flexibility, Pain, Postural dysfunction, Hypomobility, Increased muscle spasms  Visit Diagnosis: Cervicalgia  Cramp and spasm  Abnormal posture     Problem List Patient Active Problem List   Diagnosis Date Noted   Hyperlipidemia 06/24/2018   Left hemiparesis ([place]) 06/22/2018   ADHD 06/22/2018   Paronychia of toe of left foot 06/11/2015   Cough 06/11/2015   Irregular heart beats 12/19/2014   Difficulty concentrating 12/19/2014   Preventative health care 12/18/2014   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

## 2021-03-03 ENCOUNTER — Ambulatory Visit: Payer: Managed Care, Other (non HMO) | Attending: Internal Medicine

## 2021-03-03 ENCOUNTER — Other Ambulatory Visit: Payer: Self-pay

## 2021-03-03 DIAGNOSIS — R252 Cramp and spasm: Secondary | ICD-10-CM | POA: Diagnosis present

## 2021-03-03 DIAGNOSIS — M542 Cervicalgia: Secondary | ICD-10-CM | POA: Insufficient documentation

## 2021-03-03 DIAGNOSIS — R293 Abnormal posture: Secondary | ICD-10-CM | POA: Diagnosis present

## 2021-03-03 NOTE — Therapy (Signed)
Michael Stark @ [facility] [place], [place], [address] Phone: [phone]   Fax:  [phone]  Physical Therapy Treatment  Patient Details  Name: Michael Stark MRN: [id number] Date of Birth: 1980-10-18 Referring Provider (PT): Lelon Frohlich, MD   Encounter Date: 03/03/2021   PT End of Session - 03/03/21 1650     Visit Number 13    PT Start Time 0086    PT Stop Time 7619    PT Time Calculation (min) 40 min    Activity Tolerance Patient tolerated treatment well    Behavior During Therapy [facility] for tasks assessed/performed             Past Medical History:  Diagnosis Date   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   Subjective Assessment - 03/03/21 1625     Subjective I feel 85-90% better.  Less tension now vs. last visit.    Pertinent History Lt hemiparesis/muscle atrophy- age 26 due to underlying condition in the Rt motor cortex.  Now resultant Lt UE/LE weakness and assymmetries- mild.    Currently in Pain? No/denies                [facility] PT Assessment - 03/03/21 0001       Assessment   Medical Diagnosis neck pain    Referring Provider (PT) Lelon Frohlich, MD    Onset Date/Surgical Date 08/04/18    Hand Dominance Right      Observation/Other Assessments   Focus on Therapeutic Outcomes (FOTO)  87 (goal is 38)      Posture/Postural Control   Posture Comments Lt LE atrophy, Lt intrascapular atrophy      AROM   Cervical Flexion 55    Cervical - Right Side Bend 45    Cervical - Left Side Bend 45    Cervical - Right Rotation 65    Cervical - Left Rotation 65      Palpation   Spinal mobility improved  thoracic mobility compared to previous visits, WNL    Palpation comment mild presence of TP in Lt upper trap, small in size, tightness and tenderness in Lt scalenes                           [facility] Adult PT Treatment/Exercise - 03/03/21 0001       Moist Heat Therapy   Number Minutes Moist Heat 8 Minutes    Moist Heat Location Cervical   Lt     Manual Therapy   Manual Therapy Passive ROM;Soft tissue mobilization;Joint mobilization    Manual therapy comments active release Lt scalenes, supine, PT tacking distal scalenes with Pt A/ROM into Rt SB x 10 reps    Soft tissue mobilization TP release Lt levator, elongation Lt scalenes, upper trap    Passive ROM cervical Rot, bil, x 10 sec end range stretch, x 2              Trigger Point Dry Needling - 03/03/21 0001     Consent Given? Yes    Education Handout Provided Previously provided    Muscles Treated Head and  Neck Upper trapezius;Cervical multifidi    Dry Needling Comments Lt, ant and post approach    Upper Trapezius Response Twitch reponse elicited;Palpable increased muscle length   only mild twitch response today, expected due to signif smaller TP   Cervical multifidi Response Twitch reponse elicited;Palpable increased muscle length                     PT Short Term Goals - 12/10/20 1719       PT SHORT TERM GOAL #1   Title be independent in initial HEP    Status Achieved      PT SHORT TERM GOAL #2   Title report a 30% reduction in neck stiffness with driving and ADLs    Status Achieved               PT Long Term Goals - 03/03/21 1626       PT LONG TERM GOAL #1   Title be indpendent in advanced HEP    Status Achieved      PT LONG TERM GOAL #2   Title verbalize and demonstrate body mechanics modifications for cervical protection with daily tasks    Status Achieved      PT LONG TERM GOAL #3   Title report a 60% reduction in neck pain with turning head with driving    Baseline  85-90%    Status Achieved      PT LONG TERM GOAL #4   Title Pt will report reduced incidence of tingling with desktop work and at night with sleeping to no more than twice weekly.    Baseline intermittent but less frequent    Status Partially Met                   Plan - 03/03/21 1653     Clinical Impression Statement Pt reports 85-90% overall improvement and reduced tingling overall since last session.  Pt is managing symptoms with flexibility, strength and postural corrections.  Pt with improved tension in the Lt neck and upper traps today with fewer twitch responses and improved mobility overall.  Pt demonstrated good mobility after session today.  Pt has met all goals and will D/C to HEP.  Pt will follow-up with MD to discuss LE pain and ask for referral as appropriate.    PT Next Visit Plan D/C PT to HEP    PT Home Exercise Plan Access Code: [email or web address]    Consulted and Agree with Plan of Care Patient             Patient will benefit from skilled therapeutic intervention in order to improve the following deficits and impairments:     Visit Diagnosis: Abnormal posture  Cramp and spasm  Cervicalgia     Problem List Patient Active Problem List   Diagnosis Date Noted   Hyperlipidemia 06/24/2018   Left hemiparesis ([place]) 06/22/2018   ADHD 06/22/2018   Paronychia of toe of left foot 06/11/2015   Cough 06/11/2015   Irregular heart beats 12/19/2014   Difficulty concentrating 12/19/2014   Preventative health care 12/18/2014   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  All goals met   Remaining deficits: Lt upper trap tension and tingling at times.     Education / Equipment: HEP, posture   Patient agrees to discharge. Patient goals were met. Patient is being  discharged due to meeting the stated rehab goals.    Sigurd Sos, PT 03/03/21 4:54 PM   [facility] @ [place], [place], [address] Phone: [phone]   Fax:  [phone]  Name: Michael Stark MRN: [id number] Date of Birth: Feb 27, 1981

## 2021-03-11 ENCOUNTER — Encounter: Payer: Managed Care, Other (non HMO) | Admitting: Physical Therapy

## 2021-03-13 ENCOUNTER — Ambulatory Visit (INDEPENDENT_AMBULATORY_CARE_PROVIDER_SITE_OTHER): Payer: Managed Care, Other (non HMO) | Admitting: *Deleted

## 2021-03-13 DIAGNOSIS — Z23 Encounter for immunization: Secondary | ICD-10-CM | POA: Diagnosis not present

## 2021-04-11 IMAGING — DX DG CERVICAL SPINE COMPLETE 4+V
6 series · 6 of 6 positions shown · non-contrast
Comparison: No prior.

CLINICAL DATA: Neck pain.  Left arm numbness.

EXAM:
CERVICAL SPINE - COMPLETE 4+ VIEW

[cervical spine lat]
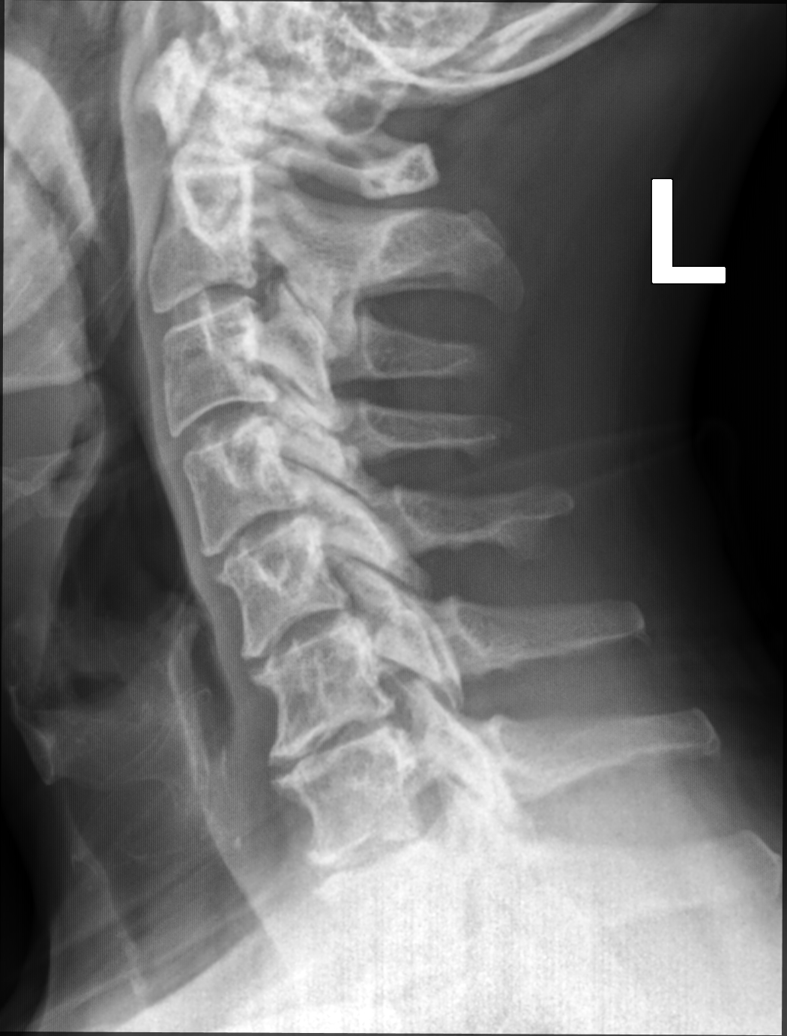

[cervical spine oblique (1 of 2)]
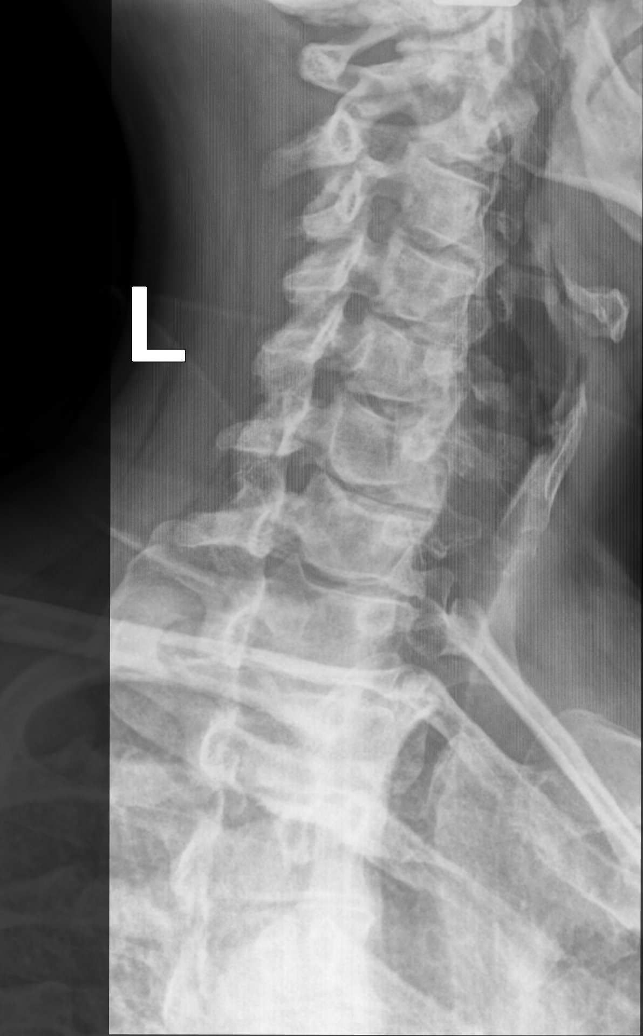

[cervical spine oblique (2 of 2)]
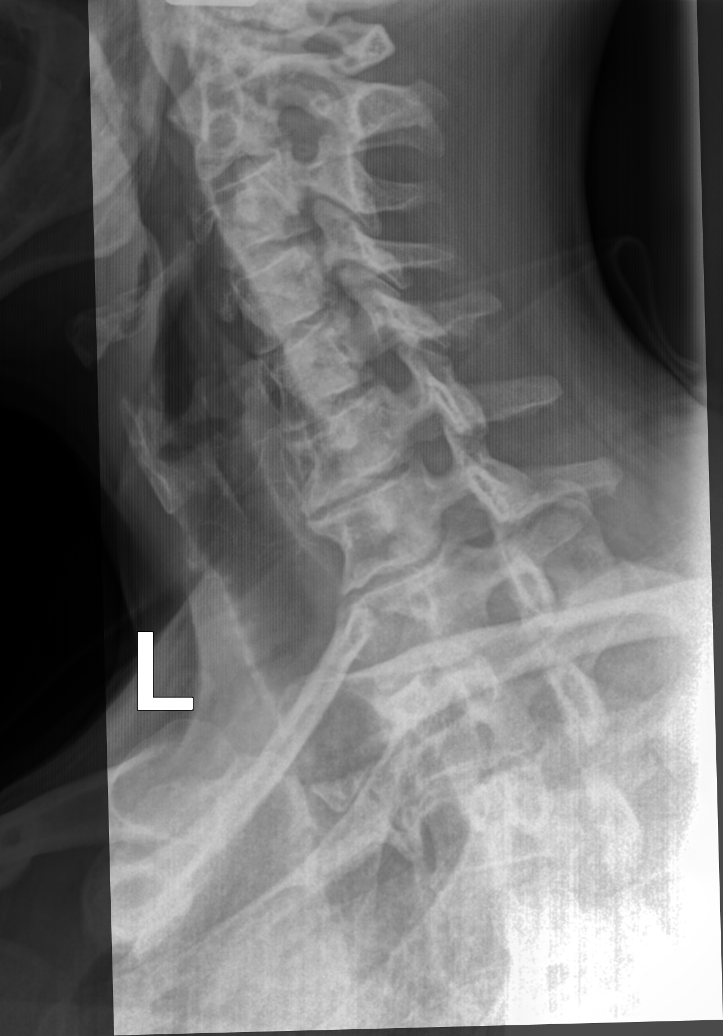

[cervical spine ap]
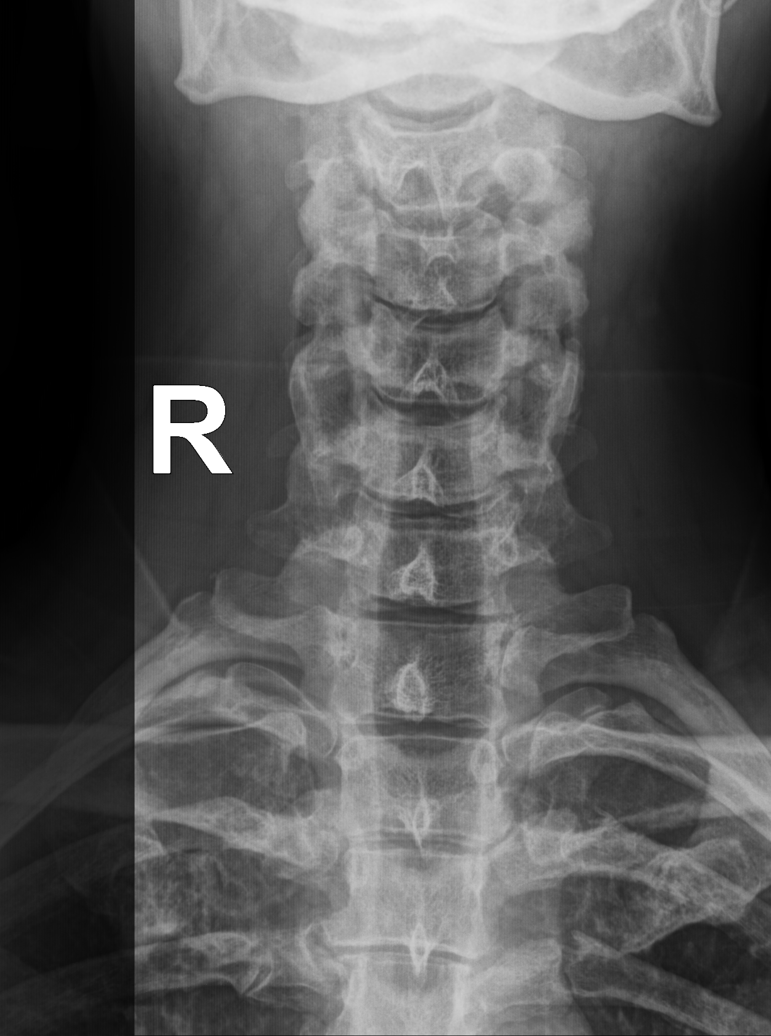

[cervical spine open mouth ap (1 of 2)]
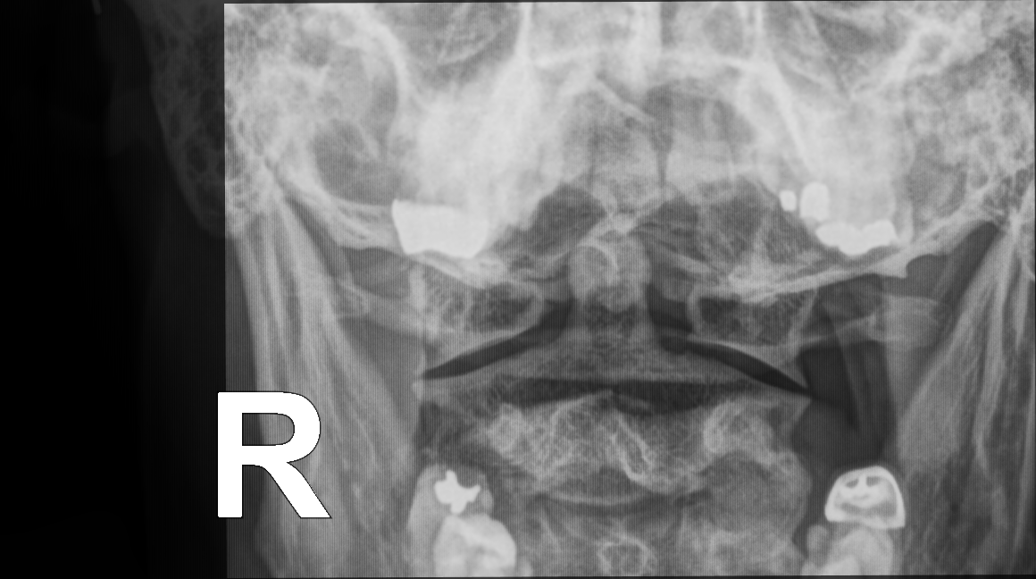

[cervical spine open mouth ap (2 of 2)]
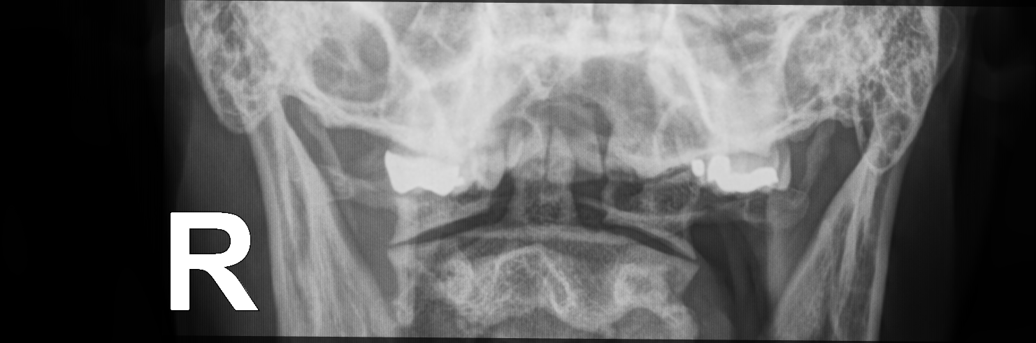

[6 of 6 positions shown; findings below may reference images not displayed]

FINDINGS: Diffuse multilevel degenerative change with mild straightening of
the cervical spine. No acute bony abnormality identified. No
evidence of fracture. Pulmonary apices are clear.
IMPRESSION: Diffuse multilevel degenerative change with mild straightening of
the cervical spine. No acute abnormality identified.

## 2021-05-12 ENCOUNTER — Encounter: Payer: Self-pay | Admitting: Internal Medicine

## 2021-05-12 ENCOUNTER — Other Ambulatory Visit: Payer: Self-pay | Admitting: Internal Medicine

## 2021-05-12 ENCOUNTER — Ambulatory Visit (INDEPENDENT_AMBULATORY_CARE_PROVIDER_SITE_OTHER): Payer: Managed Care, Other (non HMO) | Admitting: Internal Medicine

## 2021-05-12 VITALS — BP 102/70 | HR 61 | Temp 97.9°F | Ht 74.0 in | Wt 262.0 lb

## 2021-05-12 DIAGNOSIS — G40309 Generalized idiopathic epilepsy and epileptic syndromes, not intractable, without status epilepticus: Secondary | ICD-10-CM | POA: Diagnosis not present

## 2021-05-12 DIAGNOSIS — E559 Vitamin D deficiency, unspecified: Secondary | ICD-10-CM

## 2021-05-12 DIAGNOSIS — Z23 Encounter for immunization: Secondary | ICD-10-CM

## 2021-05-12 DIAGNOSIS — Z0000 Encounter for general adult medical examination without abnormal findings: Secondary | ICD-10-CM

## 2021-05-12 DIAGNOSIS — G8194 Hemiplegia, unspecified affecting left nondominant side: Secondary | ICD-10-CM | POA: Diagnosis not present

## 2021-05-12 DIAGNOSIS — E782 Mixed hyperlipidemia: Secondary | ICD-10-CM

## 2021-05-12 DIAGNOSIS — F909 Attention-deficit hyperactivity disorder, unspecified type: Secondary | ICD-10-CM | POA: Diagnosis not present

## 2021-05-12 LAB — CBC WITH DIFFERENTIAL/PLATELET
Basophils Absolute: 0 10*3/uL (ref 0.0–0.1)
Basophils Relative: 0.5 % (ref 0.0–3.0)
Eosinophils Absolute: 0.3 10*3/uL (ref 0.0–0.7)
Eosinophils Relative: 4.8 % (ref 0.0–5.0)
HCT: 45.3 % (ref 39.0–52.0)
Hemoglobin: 15.5 g/dL (ref 13.0–17.0)
Lymphocytes Relative: 34.4 % (ref 12.0–46.0)
Lymphs Abs: 2 10*3/uL (ref 0.7–4.0)
MCHC: 34.3 g/dL (ref 30.0–36.0)
MCV: 87.5 fl (ref 78.0–100.0)
Monocytes Absolute: 0.5 10*3/uL (ref 0.1–1.0)
Monocytes Relative: 8.3 % (ref 3.0–12.0)
Neutro Abs: 3 10*3/uL (ref 1.4–7.7)
Neutrophils Relative %: 52 % (ref 43.0–77.0)
Platelets: 159 10*3/uL (ref 150.0–400.0)
RBC: 5.17 Mil/uL (ref 4.22–5.81)
RDW: 12.4 % (ref 11.5–15.5)
WBC: 5.8 10*3/uL (ref 4.0–10.5)

## 2021-05-12 LAB — COMPREHENSIVE METABOLIC PANEL
ALT: 20 U/L (ref 0–53)
AST: 17 U/L (ref 0–37)
Albumin: 4.8 g/dL (ref 3.5–5.2)
Alkaline Phosphatase: 61 U/L (ref 39–117)
BUN: 26 mg/dL — ABNORMAL HIGH (ref 6–23)
CO2: 28 mEq/L (ref 19–32)
Calcium: 9.5 mg/dL (ref 8.4–10.5)
Chloride: 103 mEq/L (ref 96–112)
Creatinine, Ser: 0.95 mg/dL (ref 0.40–1.50)
GFR: 98.92 mL/min (ref >60.00)
Glucose, Bld: 84 mg/dL (ref 70–99)
Potassium: 4.6 mEq/L (ref 3.5–5.1)
Sodium: 138 mEq/L (ref 135–145)
Total Bilirubin: 0.6 mg/dL (ref 0.2–1.2)
Total Protein: 7.3 g/dL (ref 6.0–8.3)

## 2021-05-12 LAB — LIPID PANEL
Cholesterol: 219 mg/dL — ABNORMAL HIGH (ref 0–200)
HDL: 38.8 mg/dL — ABNORMAL LOW (ref >39.00)
LDL Cholesterol: 162 mg/dL — ABNORMAL HIGH (ref 0–99)
NonHDL: 179.7
Total CHOL/HDL Ratio: 6
Triglycerides: 89 mg/dL (ref 0.0–149.0)
VLDL: 17.8 mg/dL (ref 0.0–40.0)

## 2021-05-12 LAB — PSA: PSA: 2.36 ng/mL (ref 0.10–4.00)

## 2021-05-12 LAB — HEMOGLOBIN A1C: Hgb A1c MFr Bld: 5.1 % (ref 4.6–6.5)

## 2021-05-12 LAB — TSH: TSH: 2.22 u[IU]/mL (ref 0.35–5.50)

## 2021-05-12 LAB — VITAMIN B12: Vitamin B-12: 428 pg/mL (ref 211–911)

## 2021-05-12 LAB — VITAMIN D 25 HYDROXY (VIT D DEFICIENCY, FRACTURES): VITD: 18.36 ng/mL — ABNORMAL LOW (ref 30.00–100.00)

## 2021-05-12 MED ORDER — LAMOTRIGINE 100 MG PO TABS: 150.0000 mg | ORAL_TABLET | Freq: Two times a day (BID) | ORAL | 0 refills | Status: AC

## 2021-05-12 MED ORDER — ATORVASTATIN CALCIUM 40 MG PO TABS: 40.0000 mg | ORAL_TABLET | Freq: Every day | ORAL | 1 refills | Status: AC

## 2021-05-12 MED ORDER — VITAMIN D (ERGOCALCIFEROL) 1.25 MG (50000 UNIT) PO CAPS: 50000.0000 [IU] | ORAL_CAPSULE | ORAL | 0 refills | Status: AC

## 2021-05-12 NOTE — Progress Notes (Signed)
Established Patient Office Visit     This visit occurred during the SARS-CoV-2 public health emergency.  Safety protocols were in place, including screening questions prior to the visit, additional usage of staff PPE, and extensive cleaning of exam room while observing appropriate contact time as indicated for disinfecting solutions.    CC/Reason for Visit: Annual preventive exam  HPI: Michael Stark is a 41 y.o. male who is coming in today for the above mentioned reasons. Past Medical History is significant for:  left sided hemiparesis ever since childhood.  Best has been explained to him, the anesthetic that he received for an ear tube surgery caused damage to some upper motor neurons that in turn caused his hemiparesis.  He has chronic weakness and left-sided muscle atrophy of both his left arm and leg.  He has chronic seizures and ADHD.  He has routine eye and dental care.  All immunizations are up-to-date.  He had 2 doses of HPV and Twinrix series last year.  He will be due for Tdap in August 2023.   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 He has never used smokeless tobacco. He reports current alcohol use. He reports that he does not use drugs.  Allergies: Allergies  Allergen Reactions   Other     Cats, and seasonal   Cefaclor Rash    Family History:  Family History  Problem Relation Age of Onset   Anxiety disorder Mother    Anxiety disorder Brother      Current Outpatient Medications:    cetirizine (ZYRTEC) 10 MG tablet, Take 10 mg by mouth once., Disp: , Rfl:    cyclobenzaprine (FLEXERIL) 10 MG tablet, Take  1 tablet (10 mg total) by mouth 3 (three) times daily as needed for muscle spasms., Disp: 30 tablet, Rfl: 0   lamoTRIgine (LAMICTAL) 100 MG tablet, TAKE 1 TABLET (100 MG TOTAL) BY MOUTH 2 (TWO) TIMES DAILY., Disp: 20 tablet, Rfl: 0   naproxen (NAPROSYN) 500 MG tablet, TAKE 1 TABLET BY MOUTH TWICE A DAY WITH MEALS AS NEEDED FOR PAIN, Disp: 30 tablet, Rfl: 0   VYVANSE 20 MG capsule, neurology, Disp: , Rfl:   Review of Systems:  Constitutional: Denies fever, chills, diaphoresis, appetite change and fatigue.  HEENT: Denies photophobia, eye pain, redness, hearing loss, ear pain, congestion, sore throat, rhinorrhea, sneezing, mouth sores, trouble swallowing, neck pain, neck stiffness and tinnitus.   Respiratory: Denies SOB, DOE, cough, chest tightness,  and wheezing.   Cardiovascular: Denies chest pain, palpitations and leg swelling.  Gastrointestinal: Denies nausea, vomiting, abdominal pain, diarrhea, constipation, blood in stool and abdominal distention.  Genitourinary: Denies dysuria, urgency, frequency, hematuria, flank pain and difficulty urinating.  Endocrine: Denies: hot or cold intolerance, sweats, changes in hair or nails, polyuria, polydipsia. Musculoskeletal: Denies myalgias, back pain, joint swelling, arthralgias and gait problem.  Skin: Denies pallor, rash and wound.  Neurological: Denies dizziness, seizures, syncope, weakness, light-headedness, numbness and headaches.  Hematological: Denies adenopathy. Easy bruising, personal or family bleeding history  Psychiatric/Behavioral: Denies suicidal ideation, mood changes, confusion, nervousness, sleep disturbance and agitation  Physical Exam: Vitals:   05/12/21 0754  BP: 102/70  Pulse: 61  Temp: 97.9 F (36.6 C)  TempSrc: Oral  SpO2: 96%  Weight: 262 lb (118.8 kg)  Height: 6\' 2"  (1.88 m)    Body mass index is 33.64 kg/m.   Constitutional: NAD, calm, comfortable Eyes: PERRL, lids and conjunctivae normal, wears corrective  lenses ENMT: Mucous membranes are moist. Posterior pharynx clear of any exudate or lesions. Normal dentition. Tympanic membrane is pearly white, no erythema or bulging. Neck: normal, supple, no masses, no thyromegaly Respiratory: clear to auscultation bilaterally, no wheezing, no crackles. Normal respiratory effort. No accessory muscle use.  Cardiovascular: Regular rate and rhythm, no murmurs / rubs / gallops. No extremity edema. 2+ pedal pulses. No carotid bruits.  Abdomen: no tenderness, no masses palpated. No hepatosplenomegaly. Bowel sounds positive.  Musculoskeletal: no clubbing / cyanosis. No joint deformity upper and lower extremities. Good ROM, no contractures. Normal muscle tone.  Skin: no rashes, lesions, ulcers. No induration Neurologic: CN 2-12 grossly intact. Sensation intact, DTR normal. Strength 5/5 in all 4.  A little weaker on the left Psychiatric: Normal judgment and insight. Alert and oriented x 3. Normal mood.    Impression and Plan:  Encounter for preventive health examination  -Recommend routine eye and dental care. -Immunizations: He will get his third dose of Twinrix and HPV today, otherwise immunizations are up-to-date. -Healthy lifestyle discussed in detail. -Labs to be updated today. -Colon cancer screening: Commence age 75 -Breast cancer screening: Not applicable -Cervical cancer screening: Not applicable -Lung cancer screening: Not applicable -Prostate cancer screening: PSA -DEXA: Not applicable  Mixed hyperlipidemia  - Plan: CBC with Differential/Platelet, Comprehensive metabolic panel, Hemoglobin A1c, Lipid panel -Check lipids today.  Attention deficit hyperactivity disorder (ADHD), unspecified ADHD type -On Vyvanse, followed by neurology  Left hemiparesis ([place]) Generalized convulsive epilepsy ([place])  -On Lamictal. -Followed by neurology.    Patient Instructions  -Nice seeing you today!!  -Lab work today; will notify you once results are  available.  -Schedule follow up in 1 year or sooner as needed.      Lelon Frohlich, MD [facility]

## 2021-05-12 NOTE — Patient Instructions (Signed)
-  Nice seeing you today!!  -Lab work today; will notify you once results are available.  -Schedule follow up in 1 year or sooner as needed. 

## 2021-05-12 NOTE — Addendum Note (Signed)
Addended by: Gwenyth Ober R on: 05/12/2021 09:45 AM   Modules accepted: Orders

## 2021-08-20 ENCOUNTER — Other Ambulatory Visit: Payer: Self-pay | Admitting: Internal Medicine

## 2021-08-20 DIAGNOSIS — E559 Vitamin D deficiency, unspecified: Secondary | ICD-10-CM

## 2021-08-21 ENCOUNTER — Other Ambulatory Visit: Payer: Self-pay | Admitting: Internal Medicine

## 2021-08-21 DIAGNOSIS — E559 Vitamin D deficiency, unspecified: Secondary | ICD-10-CM
